# Patient Record
Sex: Female | Born: 1990 | Race: Black or African American | Hispanic: No | Marital: Single | Smoking: Never smoker
Health system: Southern US, Community
[De-identification: ages and names within clinical notes are randomized; demographics above are authoritative.]

## PROBLEM LIST (undated history)

## (undated) DIAGNOSIS — L0291 Cutaneous abscess, unspecified: Secondary | ICD-10-CM

## (undated) DIAGNOSIS — IMO0001 Reserved for inherently not codable concepts without codable children: Secondary | ICD-10-CM

## (undated) DIAGNOSIS — L0390 Cellulitis, unspecified: Secondary | ICD-10-CM

## (undated) DIAGNOSIS — A749 Chlamydial infection, unspecified: Secondary | ICD-10-CM

## (undated) HISTORY — DX: Reserved for inherently not codable concepts without codable children: IMO0001

## (undated) HISTORY — DX: Chlamydial infection, unspecified: A74.9

## (undated) HISTORY — DX: Cellulitis, unspecified: L02.91

## (undated) HISTORY — DX: Cellulitis, unspecified: L03.90

---
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
## 2012-05-26 ENCOUNTER — Other Ambulatory Visit (HOSPITAL_COMMUNITY)
Admission: RE | Admit: 2012-05-26 | Discharge: 2012-05-26 | Disposition: A | Discharge status: Home / Self Care | Payer: Medicaid Other | Source: Ambulatory Visit | Attending: Family Medicine | Admitting: Family Medicine

## 2012-05-26 ENCOUNTER — Encounter: Payer: Self-pay | Admitting: Family Medicine

## 2012-05-26 ENCOUNTER — Ambulatory Visit (INDEPENDENT_AMBULATORY_CARE_PROVIDER_SITE_OTHER): Payer: Medicaid Other | Admitting: Family Medicine

## 2012-05-26 VITALS — BP 120/77 | HR 88 | Temp 98.1°F | Ht 61.75 in | Wt 145.0 lb

## 2012-05-26 DIAGNOSIS — N760 Acute vaginitis: Secondary | ICD-10-CM

## 2012-05-26 DIAGNOSIS — Z00129 Encounter for routine child health examination without abnormal findings: Secondary | ICD-10-CM

## 2012-05-26 DIAGNOSIS — Z124 Encounter for screening for malignant neoplasm of cervix: Secondary | ICD-10-CM

## 2012-05-26 DIAGNOSIS — Z113 Encounter for screening for infections with a predominantly sexual mode of transmission: Secondary | ICD-10-CM | POA: Insufficient documentation

## 2012-05-26 DIAGNOSIS — Z0000 Encounter for general adult medical examination without abnormal findings: Secondary | ICD-10-CM

## 2012-05-26 DIAGNOSIS — N920 Excessive and frequent menstruation with regular cycle: Secondary | ICD-10-CM

## 2012-05-26 DIAGNOSIS — Z01419 Encounter for gynecological examination (general) (routine) without abnormal findings: Secondary | ICD-10-CM | POA: Insufficient documentation

## 2012-05-26 NOTE — Assessment & Plan Note (Signed)
Pap smear today Testing for GC/Chlamydia per patient request. She is sexually active with boyfriend/FOB only

## 2012-05-26 NOTE — Patient Instructions (Addendum)
If your lab results are normal, I will send you a letter with the results. If abnormal, someone at the clinic will get in touch with you.   If you have heavy periods for more than 2 weeks or heavy periods and persistent lightheadedness, drink plenty of fluids (6-8 glasses) and if that does not help, please make an appointment   Follow-up in 1 year for your next physical or sooner if needed

## 2012-05-26 NOTE — Progress Notes (Signed)
  Subjective:    Patient ID: Chloe Curtis, female    DOB: 11-19-1990, 21 y.o.   MRN: [id number]  HPI Well visit  # Irregular vaginal bleeding She received Implanon 06/2010 Since then, she gets periods every 2-3 months that last a few days She started her current period a few days ago and it has been very heavy but has been lightening up ROS: denies lightheadedness  # Preventative visit  Review of Systems Per HPI Denies depression  Allergies, medication, past medical history reviewed.  She is attending university at [employer] and studying psychology    Objective:   Physical Exam GEN: NAD; well-appearing; overweight CV: RRR, no m/r/g PULM: NI WOB ABD: soft, NT, ND GU: normal vulva, vagina, cervix; scant blood in posterior vault; no cervical motion tenderness; uterus non-tender, normal consistency SKIN: 1-2 cap refill; warm, dry    Assessment & Plan:

## 2012-05-26 NOTE — Assessment & Plan Note (Signed)
Heavier period than normal, however, lightening up and hemodynamically stable Given indications to RTC

## 2012-05-27 ENCOUNTER — Encounter: Payer: Self-pay | Admitting: Family Medicine

## 2012-05-28 ENCOUNTER — Telehealth: Payer: Self-pay | Admitting: Family Medicine

## 2012-05-28 DIAGNOSIS — A5601 Chlamydial cystitis and urethritis: Secondary | ICD-10-CM

## 2012-05-28 MED ORDER — AZITHROMYCIN 1 G PO PACK: 1.0000 | PACK | Freq: Once | ORAL | Status: AC

## 2012-05-28 NOTE — Telephone Encounter (Signed)
See problem list

## 2012-05-28 NOTE — Assessment & Plan Note (Signed)
Found on STI testing during well visit. She had it in the past and was treated but her partner was not. Will send Rx for her and partner. Patient called and notified.

## 2012-06-01 ENCOUNTER — Encounter: Payer: Self-pay | Admitting: Family Medicine

## 2012-10-05 ENCOUNTER — Ambulatory Visit (INDEPENDENT_AMBULATORY_CARE_PROVIDER_SITE_OTHER): Payer: Medicaid Other | Admitting: Family Medicine

## 2012-10-05 ENCOUNTER — Other Ambulatory Visit (HOSPITAL_COMMUNITY)
Admission: RE | Admit: 2012-10-05 | Discharge: 2012-10-05 | Disposition: A | Discharge status: Home / Self Care | Payer: Medicaid Other | Source: Ambulatory Visit | Attending: Family Medicine | Admitting: Family Medicine

## 2012-10-05 ENCOUNTER — Ambulatory Visit: Payer: Medicaid Other | Admitting: Family Medicine

## 2012-10-05 VITALS — BP 113/73 | HR 92 | Temp 98.9°F | Ht 61.75 in | Wt 155.0 lb

## 2012-10-05 DIAGNOSIS — N898 Other specified noninflammatory disorders of vagina: Secondary | ICD-10-CM

## 2012-10-05 DIAGNOSIS — Z113 Encounter for screening for infections with a predominantly sexual mode of transmission: Secondary | ICD-10-CM | POA: Insufficient documentation

## 2012-10-05 DIAGNOSIS — K649 Unspecified hemorrhoids: Secondary | ICD-10-CM

## 2012-10-05 DIAGNOSIS — B9689 Other specified bacterial agents as the cause of diseases classified elsewhere: Secondary | ICD-10-CM | POA: Insufficient documentation

## 2012-10-05 DIAGNOSIS — A499 Bacterial infection, unspecified: Secondary | ICD-10-CM

## 2012-10-05 DIAGNOSIS — K644 Residual hemorrhoidal skin tags: Secondary | ICD-10-CM | POA: Insufficient documentation

## 2012-10-05 DIAGNOSIS — N760 Acute vaginitis: Secondary | ICD-10-CM | POA: Insufficient documentation

## 2012-10-05 LAB — POCT WET PREP (WET MOUNT): Clue Cells Wet Prep Whiff POC: POSITIVE

## 2012-10-05 MED ORDER — FLUCONAZOLE 150 MG PO TABS: 150.0000 mg | ORAL_TABLET | Freq: Once | ORAL | Status: DC

## 2012-10-05 MED ORDER — METRONIDAZOLE 500 MG PO TABS: 500.0000 mg | ORAL_TABLET | Freq: Two times a day (BID) | ORAL | Status: DC

## 2012-10-05 NOTE — Patient Instructions (Addendum)
We will call with the results. If you don't hear from [place] by Monday, please call the clinic and remind [place].

## 2012-10-05 NOTE — Progress Notes (Signed)
  Subjective:    Patient ID: Chloe Curtis, female    DOB: 01/08/1991, 22 y.o.   MRN: [id number]  HPI She is concerned about vaginal discharge she has been having and wants to make sure she does not have an infection. She says sometimes it has a foul odor. She is sexually active with her boyfriend only. She has Implanon.  She is also concerned about her hemorrhoids. They have gotten better since she had a baby 1-2 years ago and she wants them checked out.  She is not using anything for them at this time. They do not hurt; sometimes they itch and feel full.   Review of Systems Denies fevers, chills, nausea/vomiting, constipation, straining  Allergies, medication, past medical history reviewed.  -History of chlamydia treated in August 2013      Objective:   Physical Exam Gen: NAD; well-appearing; overweight PSYCH: pleasant, engaged and normally conversant, appropriate to questions, alert and oriented PULM: NI WOB ABD: soft, NT, ND GU:    Vulva: appears normal   Vagina: blood in posterior vault (she started her period)   Cervix: no cervical motion tenderness   Rectum: external hemorrhoids, benign-appearing and non-inflamed and non-tender     Assessment & Plan:

## 2012-10-05 NOTE — Assessment & Plan Note (Addendum)
White discharge without vaginal irritation.  Wet prep positive for clue cells and a few yeast. Will treat both.>>>Called patient and notified Rx metronidazole and Diflucan ready for pick-up.  GC/Chlamydia pending.

## 2012-10-06 ENCOUNTER — Telehealth: Payer: Self-pay | Admitting: Family Medicine

## 2012-10-06 NOTE — Telephone Encounter (Signed)
LM with person who answered the phone to have patient call back. Chloe Curtis, Chloe Curtis

## 2012-10-06 NOTE — Telephone Encounter (Signed)
Please notify negative GC/Chlamydia 

## 2012-10-11 ENCOUNTER — Encounter (HOSPITAL_COMMUNITY): Payer: Self-pay | Admitting: *Deleted

## 2012-10-11 ENCOUNTER — Emergency Department (HOSPITAL_COMMUNITY)
Admission: EM | Admit: 2012-10-11 | Discharge: 2012-10-11 | Disposition: A | Discharge status: Home / Self Care | Payer: Medicaid Other | Attending: Emergency Medicine | Admitting: Emergency Medicine

## 2012-10-11 DIAGNOSIS — J45909 Unspecified asthma, uncomplicated: Secondary | ICD-10-CM | POA: Insufficient documentation

## 2012-10-11 DIAGNOSIS — B9789 Other viral agents as the cause of diseases classified elsewhere: Secondary | ICD-10-CM

## 2012-10-11 DIAGNOSIS — Z8619 Personal history of other infectious and parasitic diseases: Secondary | ICD-10-CM | POA: Insufficient documentation

## 2012-10-11 DIAGNOSIS — R0602 Shortness of breath: Secondary | ICD-10-CM | POA: Insufficient documentation

## 2012-10-11 DIAGNOSIS — Z79899 Other long term (current) drug therapy: Secondary | ICD-10-CM | POA: Insufficient documentation

## 2012-10-11 MED ORDER — AEROCHAMBER PLUS W/MASK MISC
Status: AC
  Filled 2012-10-11: qty 1

## 2012-10-11 MED ORDER — ALBUTEROL SULFATE HFA 108 (90 BASE) MCG/ACT IN AERS
2.0000 | INHALATION_SPRAY | RESPIRATORY_TRACT | Status: DC | PRN
  Administered 2012-10-11: 2 via RESPIRATORY_TRACT
  Filled 2012-10-11: qty 6.7

## 2012-10-11 MED ORDER — ACETAMINOPHEN 325 MG PO TABS
975.0000 mg | ORAL_TABLET | Freq: Once | ORAL | Status: AC
  Administered 2012-10-11: 975 mg via ORAL
  Filled 2012-10-11: qty 3

## 2012-10-11 MED ORDER — AEROCHAMBER PLUS FLO-VU LARGE MISC
1.0000 | Freq: Once | Status: AC
  Administered 2012-10-11: 1

## 2012-10-11 MED ORDER — ALBUTEROL SULFATE (5 MG/ML) 0.5% IN NEBU
5.0000 mg | INHALATION_SOLUTION | Freq: Once | RESPIRATORY_TRACT | Status: AC
  Administered 2012-10-11: 5 mg via RESPIRATORY_TRACT
  Filled 2012-10-11: qty 1

## 2012-10-11 MED ORDER — AEROCHAMBER PLUS FLO-VU SMALL MISC: 1.0000 | Freq: Once | Status: DC

## 2012-10-11 NOTE — ED Notes (Signed)
Pt c/o cough x 4 days and SOB.  Denies fevers, n/v.

## 2012-10-11 NOTE — ED Notes (Signed)
Pt placed on nebulizer mask. Pt mentating appropriately.

## 2012-10-11 NOTE — ED Notes (Signed)
Dr. Jacubowitz at bedside 

## 2012-10-11 NOTE — ED Notes (Signed)
Pt denies nausea/vomiting. Pt denies chills. Pt denies congestion/sore throat. Pt states just dry cough. Pt mentating appropriately. Pt states today she has had increased shortness of breath.

## 2012-10-11 NOTE — ED Notes (Signed)
Pt completed albuterol nebulizer

## 2012-10-11 NOTE — ED Notes (Signed)
Pt ambulatory leaving ED with family. Pt educated on taking acetaminophen at home and when/if need to return to ED. Pt verbalized understanding of teaching and has no further questions upon d/c. Pt does not appear to be in acute distress. Pt leaving with d/c teaching, inhaler, and aerochamber.

## 2012-10-11 NOTE — ED Provider Notes (Signed)
History   This chart was scribed for Doug Sou, MD by Gerlean Ren, ED Scribe. This patient was seen in room TR07C/TR07C and the patient's care was started at 8:00 PM    CSN: [id number]  Arrival date & time 10/11/12  1815   None     Chief Complaint  Patient presents with  . Cough    The history is provided by the patient. No language interpreter was used.   Chloe Curtis is a 22 y.o. female with h/o asthma who presents to the Emergency Department complaining of 4-5 days of constant dry cough with mild difficulty breathing when not coughing that pt states has been worsening today and feels similar to asthma.  Pt does not take any regular medications.  Pt has not used an inhaler in past several years.  LNMP last week.  Pt denies tobacco and alcohol use.  PCP Dr. Madolyn Frieze.  Past Medical History  Diagnosis Date  . Chlamydia infection     x2.  . Teenage mother 03/2010    Preterm labor and delivery @ 35 weeks.  . Abscess and cellulitis     s/p I&D by Dr. Shea Evans [facility])  . Asthma     History reviewed. No pertinent past surgical history.  History reviewed. No pertinent family history.  History  Substance Use Topics  . Smoking status: Never Smoker   . Smokeless tobacco: Not on file  . Alcohol Use: No    No OB history provided.   Review of Systems  Constitutional: Negative.   HENT: Negative.   Respiratory: Positive for cough and shortness of breath.   Cardiovascular: Negative.   Gastrointestinal: Negative.   Musculoskeletal: Negative.   Skin: Negative.   Neurological: Negative.   Hematological: Negative.   Psychiatric/Behavioral: Negative.     Allergies  Advair diskus  Home Medications   Current Outpatient Rx  Name  Route  Sig  Dispense  Refill  . ALBUTEROL SULFATE HFA 108 (90 BASE) MCG/ACT IN AERS   Inhalation   Inhale 2 puffs into the lungs every 4 (four) hours as needed. For shortness of breath          . ETONOGESTREL 68 MG [place]  IMPL   Subcutaneous   Inject 68 mg into the skin.            BP 123/75  Pulse 84  Temp 98.9 F (37.2 C) (Oral)  Resp 16  SpO2 99%  LMP 10/04/2012  Physical Exam  Nursing note and vitals reviewed. Constitutional: She appears well-developed and well-nourished.  HENT:  Head: Normocephalic and atraumatic.  Eyes: Conjunctivae normal are normal. Pupils are equal, round, and reactive to light.  Neck: Neck supple. No tracheal deviation present. No thyromegaly present.  Cardiovascular: Normal rate and regular rhythm.   No murmur heard. Pulmonary/Chest: Effort normal and breath sounds normal.  Abdominal: Soft. Bowel sounds are normal. She exhibits no distension. There is no tenderness.  Musculoskeletal: Normal range of motion. She exhibits no edema and no tenderness.  Neurological: She is alert. Coordination normal.  Skin: Skin is warm and dry. No rash noted.  Psychiatric: She has a normal mood and affect.    ED Course  Procedures (including critical care time) DIAGNOSTIC STUDIES: Oxygen Saturation is 99% on room air, normal by my interpretation.    COORDINATION OF CARE: 8:03 PM- Patient informed of clinical course, understands medical decision-making process, and agrees with plan.  Labs Reviewed - No data to display No results  found.   No diagnosis found.   Patient feels after treatment with albuterol nebulizer and inhaler At 9:35 PM she feels hot and continues to cough, complaining of nasal congestion and myalgias. MDM   I suspect patient has beginnings of influenza Plan Tylenol, albuterol 2 puffs every 4 hours as needed for cough or shortness of breath [facility] if not better in 2 or 3 days Dxviral respiratory illness   I personally performed the services described in this documentation, which was scribed in my presence. The recorded information has been reviewed and is accurate.      Doug Sou, MD 10/11/12 2141

## 2013-02-23 ENCOUNTER — Encounter (HOSPITAL_COMMUNITY): Payer: Self-pay | Admitting: Emergency Medicine

## 2013-02-23 ENCOUNTER — Emergency Department (HOSPITAL_COMMUNITY)
Admission: EM | Admit: 2013-02-23 | Discharge: 2013-02-23 | Disposition: A | Discharge status: Home / Self Care | Payer: Medicaid Other | Attending: Emergency Medicine | Admitting: Emergency Medicine

## 2013-02-23 DIAGNOSIS — Z3202 Encounter for pregnancy test, result negative: Secondary | ICD-10-CM | POA: Insufficient documentation

## 2013-02-23 DIAGNOSIS — R109 Unspecified abdominal pain: Secondary | ICD-10-CM | POA: Insufficient documentation

## 2013-02-23 DIAGNOSIS — Z79899 Other long term (current) drug therapy: Secondary | ICD-10-CM | POA: Insufficient documentation

## 2013-02-23 DIAGNOSIS — J45909 Unspecified asthma, uncomplicated: Secondary | ICD-10-CM | POA: Insufficient documentation

## 2013-02-23 DIAGNOSIS — N760 Acute vaginitis: Secondary | ICD-10-CM | POA: Insufficient documentation

## 2013-02-23 DIAGNOSIS — Z872 Personal history of diseases of the skin and subcutaneous tissue: Secondary | ICD-10-CM | POA: Insufficient documentation

## 2013-02-23 DIAGNOSIS — N72 Inflammatory disease of cervix uteri: Secondary | ICD-10-CM | POA: Insufficient documentation

## 2013-02-23 DIAGNOSIS — Z8619 Personal history of other infectious and parasitic diseases: Secondary | ICD-10-CM | POA: Insufficient documentation

## 2013-02-23 DIAGNOSIS — N898 Other specified noninflammatory disorders of vagina: Secondary | ICD-10-CM | POA: Insufficient documentation

## 2013-02-23 DIAGNOSIS — Z8751 Personal history of pre-term labor: Secondary | ICD-10-CM | POA: Insufficient documentation

## 2013-02-23 DIAGNOSIS — B9689 Other specified bacterial agents as the cause of diseases classified elsewhere: Secondary | ICD-10-CM

## 2013-02-23 LAB — CBC WITH DIFFERENTIAL/PLATELET
Basophils Relative: 0 % (ref 0–1)
HCT: 39.8 % (ref 36.0–46.0)
Hemoglobin: 13.7 g/dL (ref 12.0–15.0)
Lymphocytes Relative: 8 % — ABNORMAL LOW (ref 12–46)
Lymphs Abs: 1.7 10*3/uL (ref 0.7–4.0)
MCHC: 34.4 g/dL (ref 30.0–36.0)
Monocytes Absolute: 0.8 10*3/uL (ref 0.1–1.0)
Monocytes Relative: 4 % (ref 3–12)
Neutro Abs: 17.4 10*3/uL — ABNORMAL HIGH (ref 1.7–7.7)
RBC: 4.64 MIL/uL (ref 3.87–5.11)

## 2013-02-23 LAB — WET PREP, GENITAL: Yeast Wet Prep HPF POC: NONE SEEN

## 2013-02-23 LAB — URINALYSIS, ROUTINE W REFLEX MICROSCOPIC
Ketones, ur: NEGATIVE mg/dL
Leukocytes, UA: NEGATIVE
Protein, ur: NEGATIVE mg/dL
Urobilinogen, UA: 1 mg/dL (ref 0.0–1.0)

## 2013-02-23 LAB — COMPREHENSIVE METABOLIC PANEL
Alkaline Phosphatase: 71 U/L (ref 39–117)
BUN: 13 mg/dL (ref 6–23)
CO2: 25 mEq/L (ref 19–32)
Chloride: 106 mEq/L (ref 96–112)
Creatinine, Ser: 0.72 mg/dL (ref 0.50–1.10)
GFR calc Af Amer: >90 mL/min (ref >90)
GFR calc non Af Amer: >90 mL/min (ref >90)
Glucose, Bld: 113 mg/dL — ABNORMAL HIGH (ref 70–99)
Potassium: 3.9 mEq/L (ref 3.5–5.1)
Total Bilirubin: 0.6 mg/dL (ref 0.3–1.2)

## 2013-02-23 LAB — POCT PREGNANCY, URINE: Preg Test, Ur: NEGATIVE

## 2013-02-23 MED ORDER — OXYCODONE-ACETAMINOPHEN 5-325 MG PO TABS
2.0000 | ORAL_TABLET | Freq: Once | ORAL | Status: AC
  Administered 2013-02-23: 2 via ORAL
  Filled 2013-02-23: qty 2

## 2013-02-23 MED ORDER — OXYCODONE-ACETAMINOPHEN 5-325 MG PO TABS: ORAL_TABLET | ORAL | Status: DC

## 2013-02-23 MED ORDER — METRONIDAZOLE 500 MG PO TABS: 500.0000 mg | ORAL_TABLET | Freq: Two times a day (BID) | ORAL | Status: DC

## 2013-02-23 MED ORDER — LIDOCAINE HCL (PF) 1 % IJ SOLN
INTRAMUSCULAR | Status: AC
  Administered 2013-02-23: 5 mL
  Filled 2013-02-23: qty 5

## 2013-02-23 MED ORDER — CEFTRIAXONE SODIUM 250 MG IJ SOLR
250.0000 mg | Freq: Once | INTRAMUSCULAR | Status: AC
  Administered 2013-02-23: 250 mg via INTRAMUSCULAR
  Filled 2013-02-23: qty 250

## 2013-02-23 MED ORDER — DOXYCYCLINE HYCLATE 100 MG PO CAPS: 100.0000 mg | ORAL_CAPSULE | Freq: Two times a day (BID) | ORAL | Status: DC

## 2013-02-23 MED ORDER — METRONIDAZOLE 500 MG PO TABS
500.0000 mg | ORAL_TABLET | Freq: Once | ORAL | Status: AC
  Administered 2013-02-23: 500 mg via ORAL
  Filled 2013-02-23: qty 1

## 2013-02-23 MED ORDER — DOXYCYCLINE HYCLATE 100 MG PO TABS
100.0000 mg | ORAL_TABLET | Freq: Once | ORAL | Status: AC
  Administered 2013-02-23: 100 mg via ORAL
  Filled 2013-02-23: qty 1

## 2013-02-23 NOTE — ED Notes (Signed)
Pt c/o lower abdominal pain onset 0300. Pt denies n/v. Last BM this morning and normal.

## 2013-02-23 NOTE — ED Provider Notes (Signed)
History     CSN: [id number]  Arrival date & time 02/23/13  1016   First MD Initiated Contact with Patient 02/23/13 1130      Chief Complaint  Patient presents with  . Abdominal Pain    (Consider location/radiation/quality/duration/timing/severity/associated sxs/prior treatment) HPI  Chloe Curtis is a 22 y.o. female complaining of  lower abdominal pain onset this a.m. woke her from sleep at approximately 3 AM it is described as 6/10 sharp and worse with movement. She took 2 acetaminophen pills it did not help. It is not associated with fever, nausea, vomiting, dysuria, frequency, diarrhea or constipation. She states she gets this pain every now and then. But normally goes away and does not last this long. Her periods are irregular. She cannot date of her last period. She has a white vaginal discharge, non-foul-smelling. She normally gets this before her menstruation. She denies dyspareunia. Last GYN exam was within last 6 months.  Past Medical History  Diagnosis Date  . Chlamydia infection     x2.  . Teenage mother 03/2010    Preterm labor and delivery @ 35 weeks.  . Abscess and cellulitis     s/p I&D by Dr. Shea Evans [facility])  . Asthma     History reviewed. No pertinent past surgical history.  No family history on file.  History  Substance Use Topics  . Smoking status: Never Smoker   . Smokeless tobacco: Not on file  . Alcohol Use: No    OB History   Grav Para Term Preterm Abortions TAB SAB Ect Mult Living                  Review of Systems  Constitutional: Negative for fever.  Respiratory: Negative for shortness of breath.   Cardiovascular: Negative for chest pain.  Gastrointestinal: Positive for abdominal pain. Negative for nausea, vomiting and diarrhea.  Genitourinary: Negative for dysuria, frequency and vaginal discharge.  All other systems reviewed and are negative.    Allergies  Advair diskus  Home Medications   Current  Outpatient Rx  Name  Route  Sig  Dispense  Refill  . albuterol (PROVENTIL HFA;VENTOLIN HFA) 108 (90 BASE) MCG/ACT inhaler   Inhalation   Inhale 2 puffs into the lungs every 4 (four) hours as needed. For shortness of breath          . Etonogestrel (IMPLANON) 68 MG IMPL   Subcutaneous   Inject 68 mg into the skin.            BP 116/58  Pulse 76  Temp(Src) 98.3 F (36.8 C) (Oral)  Resp 18  SpO2 98%  LMP 01/12/2013  Physical Exam  Nursing note and vitals reviewed. Constitutional: She is oriented to person, place, and time. She appears well-developed and well-nourished. No distress.  HENT:  Head: Normocephalic and atraumatic.  Mouth/Throat: Oropharynx is clear and moist.  Eyes: Conjunctivae and EOM are normal. Pupils are equal, round, and reactive to light.  Cardiovascular: Normal rate, regular rhythm and intact distal pulses.   Pulmonary/Chest: Effort normal and breath sounds normal. No stridor. No respiratory distress. She has no wheezes. She has no rales. She exhibits no tenderness.  Abdominal: Soft. Bowel sounds are normal. She exhibits no distension and no mass. There is tenderness. There is no rebound and no guarding.  Tenderness to mild palpation of suprapubic area without guarding or rebound.  Genitourinary:  Pelvic exam chaperoned by technician:  No lesions or rashes, no foul smelling or abnormal  vaginal discharge, no adnexal tenderness however patient's cervix is erythematous and friable with cervical motion tenderness.  Musculoskeletal: Normal range of motion.  Neurological: She is alert and oriented to person, place, and time.  Psychiatric: She has a normal mood and affect.    ED Course  Procedures (including critical care time)  Labs Reviewed  WET PREP, GENITAL - Abnormal; Notable for the following:    Clue Cells Wet Prep HPF POC FEW (*)    WBC, Wet Prep HPF POC MANY (*)    All other components within normal limits  CBC WITH DIFFERENTIAL - Abnormal; Notable  for the following:    WBC 19.9 (*)    Neutrophils Relative % 87 (*)    Neutro Abs 17.4 (*)    Lymphocytes Relative 8 (*)    All other components within normal limits  COMPREHENSIVE METABOLIC PANEL - Abnormal; Notable for the following:    Glucose, Bld 113 (*)    All other components within normal limits  GC/CHLAMYDIA PROBE AMP  URINALYSIS, ROUTINE W REFLEX MICROSCOPIC  LIPASE, BLOOD  POCT PREGNANCY, URINE   No results found.  1. Abdominal pain   2. Cervicitis   3. Bacterial vaginosis       MDM   Filed Vitals:   02/23/13 1049 02/23/13 1410  BP: 116/58 122/73  Pulse: 76 77  Temp: 98.3 F (36.8 C) 97.8 F (36.6 C)  TempSrc: Oral Oral  Resp: 18 16  SpO2: 98% 98%     Chloe Curtis is a 22 y.o. female  with lower abdominal pain acute onset this a.m. There are no associated symptoms. Pelvic exam is consistent with PID with cervical motion tenderness. Patient has a leukocytosis of 19 and wet prep shows many white blood cells with a few clue cells. Patient does not appear septic she has no systemic complaints. She is appropriate for outpatient treatment for PID.  Medications  oxyCODONE-acetaminophen (PERCOCET/ROXICET) 5-325 MG per tablet 2 tablet (not administered)    The patient is hemodynamically stable, appropriate for, and amenable to, discharge at this time. Pt verbalized understanding and agrees with care plan. Outpatient follow-up and return precautions given.    Discharge Medication List as of 02/23/2013  1:59 PM    START taking these medications   Details  doxycycline (VIBRAMYCIN) 100 MG capsule Take 1 capsule (100 mg total) by mouth 2 (two) times daily., Starting 02/23/2013, Until Discontinued, Print    metroNIDAZOLE (FLAGYL) 500 MG tablet Take 1 tablet (500 mg total) by mouth 2 (two) times daily., Starting 02/23/2013, Until Discontinued, Print    oxyCODONE-acetaminophen (PERCOCET/ROXICET) 5-325 MG per tablet 1 to 2 tabs PO q6hrs  PRN for pain, Print                Wynetta Emery, PA-C 02/23/13 1901

## 2013-02-24 LAB — GC/CHLAMYDIA PROBE AMP: GC Probe RNA: NEGATIVE

## 2013-02-24 NOTE — ED Provider Notes (Signed)
22 y.o. Female c.o. Lower abdominal pain. Abdomen is soft with some mild si[rapubic pain   I performed a history and physical examination of Chloe Curtis and discussed her management with Chloe Curtis.  I agree with the history, physical, assessment, and plan of care, with the following exceptions: None  I was present for the following procedures: None Time Spent in Critical Care of the patient: None Time spent in discussions with the patient and family: 5  Chloe Curtis S  .    Chloe Quarry, MD 02/24/13 [phone]

## 2013-03-02 ENCOUNTER — Ambulatory Visit (INDEPENDENT_AMBULATORY_CARE_PROVIDER_SITE_OTHER): Payer: Medicaid Other | Admitting: Family Medicine

## 2013-03-02 VITALS — BP 130/75 | HR 67 | Temp 98.2°F | Wt 151.0 lb

## 2013-03-02 DIAGNOSIS — R109 Unspecified abdominal pain: Secondary | ICD-10-CM

## 2013-03-02 DIAGNOSIS — D72829 Elevated white blood cell count, unspecified: Secondary | ICD-10-CM

## 2013-03-02 DIAGNOSIS — R102 Pelvic and perineal pain: Secondary | ICD-10-CM | POA: Insufficient documentation

## 2013-03-02 DIAGNOSIS — R1030 Lower abdominal pain, unspecified: Secondary | ICD-10-CM

## 2013-03-02 LAB — CBC
HCT: 41.5 % (ref 36.0–46.0)
Hemoglobin: 14 g/dL (ref 12.0–15.0)
MCHC: 33.7 g/dL (ref 30.0–36.0)
RDW: 13.4 % (ref 11.5–15.5)
WBC: 9.2 10*3/uL (ref 4.0–10.5)

## 2013-03-02 MED ORDER — FLUCONAZOLE 150 MG PO TABS: 150.0000 mg | ORAL_TABLET | Freq: Once | ORAL | Status: DC

## 2013-03-02 MED ORDER — METRONIDAZOLE 0.75 % VA GEL: 1.0000 | Freq: Two times a day (BID) | VAGINAL | Status: DC

## 2013-03-02 NOTE — Assessment & Plan Note (Signed)
05/28 ED follow-up. Concern for PID but GC/Chlamydia found negative. She had not been tolerating doxycycline, and she feels like the pain is overall improved but still present. Denies GI symptoms.  Wet prep positive for Clue cells but also significant WBC and serum WBC elevated 19.9; she denies fevers or other systemic symptoms -Re-check WBC -Stop doxycycline  -Tx for yeast (endorses dyspareunia and mildly tender vaginal wall on PE without significant erythema) -Tx for BV with Metrogel -Follow-up in 1 week or sooner if symptoms worsen; may cancel appointment if improved

## 2013-03-02 NOTE — Progress Notes (Signed)
  Subjective:    Patient ID: Chloe Curtis, female    DOB: 10-07-1990, 22 y.o.   MRN: [id number]  HPI # She would like referral for gynecology She was seen in the ED for abdominal pain who expressed concern for PID and recommended follow-up   She was seen in the ED 05/28. The pain is improved with oxycodone prn; she has been taking about 1 a day.  QUALITY: sharp pain in her lower abdomen; it does not radiate DURATION: intermittent PROGRESSION: improved with oxycodone  ROS:  White thick vaginal discharge Denies fevers, chills, nausea, vomiting Endorses decreased appetite  Last intercourse: yesterday associated with some pain  LMP: beginning of May she thinks ; history of irregular periods; she "rarely" has periods    Her periods have always been irregular since menarche  Review of Systems Per HPI Denies dysuria/urgency/frequency Endorses occasional lower abdominal pressure Denies constipation, diarrhea (last BM yesterday)  Allergies, medication, past medical history reviewed.  Smoking status noted.     Objective:   Physical Exam GEN: NAD; well-nourished, -appearing PSYCH: alert and oriented PULM: NI WOB ABD: soft, mild tenderness suprapubic area without guarding or rebound EXT: no edema GU:    Vulva: no lesions   Vagina: thin white discharge; no fishy odor; mildly tender; no erythema   Cervix: no lesions; no cervical motion tenderness  Labs 05/28: Wet prep: Positive clue cells Positive WBC  Negative GC/Chlamydia  WBC 19.9     Assessment & Plan:

## 2013-03-02 NOTE — Patient Instructions (Addendum)
Stop doxycycline  Stop metronidazole (oral) Start Metrogel vaginal; use twice a day for 5 days  Ibuprofen 600-800 mg every 8 hours Take with food  Follow-up in 1 week; sooner if you need to; if you feel better you may cancel this appointment

## 2013-03-03 ENCOUNTER — Encounter: Payer: Self-pay | Admitting: Family Medicine

## 2013-03-27 ENCOUNTER — Emergency Department (HOSPITAL_COMMUNITY): Payer: Medicaid Other

## 2013-03-27 ENCOUNTER — Encounter (HOSPITAL_COMMUNITY): Payer: Self-pay | Admitting: Emergency Medicine

## 2013-03-27 ENCOUNTER — Emergency Department (HOSPITAL_COMMUNITY)
Admission: EM | Admit: 2013-03-27 | Discharge: 2013-03-27 | Disposition: A | Discharge status: Home / Self Care | Payer: Medicaid Other | Attending: Emergency Medicine | Admitting: Emergency Medicine

## 2013-03-27 DIAGNOSIS — Z638 Other specified problems related to primary support group: Secondary | ICD-10-CM | POA: Insufficient documentation

## 2013-03-27 DIAGNOSIS — R079 Chest pain, unspecified: Secondary | ICD-10-CM | POA: Insufficient documentation

## 2013-03-27 DIAGNOSIS — R059 Cough, unspecified: Secondary | ICD-10-CM | POA: Insufficient documentation

## 2013-03-27 DIAGNOSIS — R0602 Shortness of breath: Secondary | ICD-10-CM | POA: Insufficient documentation

## 2013-03-27 DIAGNOSIS — Z79899 Other long term (current) drug therapy: Secondary | ICD-10-CM | POA: Insufficient documentation

## 2013-03-27 DIAGNOSIS — J4521 Mild intermittent asthma with (acute) exacerbation: Secondary | ICD-10-CM

## 2013-03-27 DIAGNOSIS — Z8619 Personal history of other infectious and parasitic diseases: Secondary | ICD-10-CM | POA: Insufficient documentation

## 2013-03-27 DIAGNOSIS — J45901 Unspecified asthma with (acute) exacerbation: Secondary | ICD-10-CM | POA: Insufficient documentation

## 2013-03-27 DIAGNOSIS — Z872 Personal history of diseases of the skin and subcutaneous tissue: Secondary | ICD-10-CM | POA: Insufficient documentation

## 2013-03-27 DIAGNOSIS — R05 Cough: Secondary | ICD-10-CM | POA: Insufficient documentation

## 2013-03-27 MED ORDER — DIPHENHYDRAMINE HCL 25 MG PO CAPS
50.0000 mg | ORAL_CAPSULE | Freq: Once | ORAL | Status: AC
  Administered 2013-03-27: 50 mg via ORAL
  Filled 2013-03-27: qty 2

## 2013-03-27 MED ORDER — PREDNISONE 20 MG PO TABS
60.0000 mg | ORAL_TABLET | Freq: Once | ORAL | Status: AC
  Administered 2013-03-27: 60 mg via ORAL
  Filled 2013-03-27: qty 3

## 2013-03-27 MED ORDER — ALBUTEROL SULFATE HFA 108 (90 BASE) MCG/ACT IN AERS
2.0000 | INHALATION_SPRAY | RESPIRATORY_TRACT | Status: DC | PRN
  Administered 2013-03-27: 2 via RESPIRATORY_TRACT
  Filled 2013-03-27: qty 6.7

## 2013-03-27 MED ORDER — ALBUTEROL SULFATE (2.5 MG/3ML) 0.083% IN NEBU: 5.0000 mg | INHALATION_SOLUTION | Freq: Four times a day (QID) | RESPIRATORY_TRACT | Status: DC | PRN

## 2013-03-27 MED ORDER — ALBUTEROL SULFATE (5 MG/ML) 0.5% IN NEBU
INHALATION_SOLUTION | RESPIRATORY_TRACT | Status: AC
  Administered 2013-03-27: 5 mg via RESPIRATORY_TRACT
  Filled 2013-03-27: qty 1

## 2013-03-27 MED ORDER — ALBUTEROL SULFATE (5 MG/ML) 0.5% IN NEBU: 5.0000 mg | INHALATION_SOLUTION | Freq: Once | RESPIRATORY_TRACT | Status: AC

## 2013-03-27 MED ORDER — ONDANSETRON HCL 8 MG PO TABS
8.0000 mg | ORAL_TABLET | Freq: Once | ORAL | Status: AC
  Administered 2013-03-27: 8 mg via ORAL
  Filled 2013-03-27: qty 1

## 2013-03-27 MED ORDER — ALBUTEROL SULFATE (5 MG/ML) 0.5% IN NEBU
5.0000 mg | INHALATION_SOLUTION | Freq: Once | RESPIRATORY_TRACT | Status: AC
  Administered 2013-03-27: 5 mg via RESPIRATORY_TRACT
  Filled 2013-03-27: qty 1

## 2013-03-27 MED ORDER — PREDNISONE 20 MG PO TABS: 60.0000 mg | ORAL_TABLET | Freq: Every day | ORAL | Status: DC

## 2013-03-27 MED ORDER — IPRATROPIUM BROMIDE 0.02 % IN SOLN
0.5000 mg | Freq: Once | RESPIRATORY_TRACT | Status: AC
  Administered 2013-03-27: 0.5 mg via RESPIRATORY_TRACT
  Filled 2013-03-27: qty 2.5

## 2013-03-27 MED ORDER — ONDANSETRON 4 MG PO TBDP
8.0000 mg | ORAL_TABLET | Freq: Once | ORAL | Status: DC
  Filled 2013-03-27: qty 2

## 2013-03-27 NOTE — ED Notes (Signed)
Pt reports asthma flare up onset yesterday. Pt tried inhaler and neb treatment at home without relief. Pt with non productive cough.

## 2013-03-27 NOTE — ED Provider Notes (Signed)
History    CSN: [id number] Arrival date & time 03/27/13  1513  First MD Initiated Contact with Patient 03/27/13 1634     Chief Complaint  Patient presents with  . Asthma   (Consider location/radiation/quality/duration/timing/severity/associated sxs/prior Treatment) Patient is a 22 y.o. female presenting with asthma. The history is provided by the patient.  Asthma This is a recurrent problem. Associated symptoms include chest pain and coughing. Pertinent negatives include no chills, congestion, fever or sore throat. Associated symptoms comments: Increased wheezing over the last 2-3 days without fever. She states it feels like her asthma. She has been using her inhaler without relief. She does have a nebulizer machine but reports "the medicine never works at home, it only works in the ED.".   Past Medical History  Diagnosis Date  . Chlamydia infection     x2.  . Teenage mother 03/2010    Preterm labor and delivery @ 35 weeks.  . Abscess and cellulitis     s/p I&D by Dr. Shea Evans [facility])  . Asthma    History reviewed. No pertinent past surgical history. No family history on file. History  Substance Use Topics  . Smoking status: Never Smoker   . Smokeless tobacco: Not on file  . Alcohol Use: No   OB History   Grav Para Term Preterm Abortions TAB SAB Ect Mult Living                 Review of Systems  Constitutional: Negative for fever and chills.  HENT: Negative.  Negative for congestion, sore throat and rhinorrhea.   Respiratory: Positive for cough, shortness of breath and wheezing.   Cardiovascular: Positive for chest pain.  Gastrointestinal: Negative.   Musculoskeletal: Negative.   Skin: Negative.   Neurological: Negative.     Allergies  Advair diskus  Home Medications   Current Outpatient Rx  Name  Route  Sig  Dispense  Refill  . albuterol (PROVENTIL HFA;VENTOLIN HFA) 108 (90 BASE) MCG/ACT inhaler   Inhalation   Inhale 2 puffs into the  lungs every 4 (four) hours as needed for wheezing or shortness of breath.          . Etonogestrel (IMPLANON) 68 MG IMPL   Subcutaneous   Inject 68 mg into the skin.           BP 129/74  Pulse 117  Temp(Src) 99.6 F (37.6 C) (Oral)  Resp 20  Ht 5\' 1"  (1.549 m)  Wt 150 lb (68.04 kg)  BMI 28.36 kg/m2  SpO2 98% Physical Exam  Constitutional: She is oriented to person, place, and time. She appears well-developed and well-nourished. No distress.  HENT:  Head: Normocephalic.  Neck: Normal range of motion. Neck supple.  Cardiovascular: Normal rate and regular rhythm.   Pulmonary/Chest: Effort normal and breath sounds normal. She has no wheezes. She exhibits no tenderness.  Examined after breathing treatment x 1.   Abdominal: Soft. Bowel sounds are normal. There is no tenderness. There is no rebound and no guarding.  Musculoskeletal: Normal range of motion. She exhibits no edema.  Neurological: She is alert and oriented to person, place, and time.  Skin: Skin is warm and dry. No rash noted.  Psychiatric: She has a normal mood and affect.    ED Course  Procedures (including critical care time) Labs Reviewed - No data to display Dg Chest 2 View (if Patient Has Fever And/or Copd)  03/27/2013   *RADIOLOGY REPORT*  Clinical Data: Shortness of breath  with chest tightness and nausea. History of asthma.  CHEST - 2 VIEW  Comparison: 01/26/2006.  Findings: The heart size and mediastinal contours are stable. There is mild central airway thickening without hyperinflation, confluent airspace opacity or pleural effusion.  The osseous structures appear normal.  IMPRESSION: Mild central airway thickening attributed to asthma.  No focal airspace disease or significant hyperinflation.   Original Report Authenticated By: Carey Bullocks, M.D.   No diagnosis found. 1. Asthma   MDM  She feels only minimally better after nebulizer treatment and still feels some airway resistance despite no wheezing  heard on auscultation. Albuterol and Atrovent given, with prednisone, and patient had subsequent vomiting and heart racing which resolves over time. She feels much better from a respiratory standpoint after second treatment. Stable for discharge.   Arnoldo Hooker, PA-C 03/27/13 2007

## 2013-03-27 NOTE — ED Notes (Signed)
Pt vomited graham crackers and [employer].

## 2013-03-28 ENCOUNTER — Encounter: Payer: Medicaid Other | Admitting: Obstetrics & Gynecology

## 2013-03-28 NOTE — ED Provider Notes (Signed)
Medical screening examination/treatment/procedure(s) were performed by non-physician practitioner and as supervising physician I was immediately available for consultation/collaboration.   Nikai Quest Y. Emaad Nanna, MD 03/28/13 1026 

## 2013-06-14 ENCOUNTER — Telehealth: Payer: Self-pay | Admitting: Family Medicine

## 2013-06-14 NOTE — Telephone Encounter (Signed)
Pt called because she has forgotten the date of  When she had the  nextplanon  Implanted. She knows that it is either September or October. JW

## 2013-06-14 NOTE — Telephone Encounter (Signed)
Left message for pt.  Please inform if she calls back that her implanon was placed 05-2010.  Jazmin Hartsell,CMA

## 2013-06-28 ENCOUNTER — Ambulatory Visit: Payer: Medicaid Other | Admitting: Family Medicine

## 2013-07-01 ENCOUNTER — Ambulatory Visit (INDEPENDENT_AMBULATORY_CARE_PROVIDER_SITE_OTHER): Payer: Medicaid Other | Admitting: Family Medicine

## 2013-07-01 ENCOUNTER — Encounter: Payer: Self-pay | Admitting: Family Medicine

## 2013-07-01 VITALS — BP 118/70 | HR 86 | Temp 99.0°F | Ht 61.0 in | Wt 155.1 lb

## 2013-07-01 DIAGNOSIS — Z3046 Encounter for surveillance of implantable subdermal contraceptive: Secondary | ICD-10-CM | POA: Insufficient documentation

## 2013-07-01 MED ORDER — NORGESTIMATE-ETH ESTRADIOL 0.25-35 MG-MCG PO TABS: 1.0000 | ORAL_TABLET | Freq: Every day | ORAL | Status: DC

## 2013-07-01 NOTE — Patient Instructions (Addendum)
It was nice to see you today.  Implanon was removed successfully.  I sent an oral contraceptive to your pharmacy.

## 2013-07-01 NOTE — Progress Notes (Signed)
Subjective:     Patient ID: Chloe Curtis, female   DOB: Sep 07, 1991, 22 y.o.   MRN: [id number]  HPI 22 year old female presents today for implanon removal.  Her Implanon has been in since 05/2010 and thus is due to be removed.  She has no complaints today.  After removal she would like to start OCP's.  Review of Systems  Constitutional: Negative for fever and chills.  Respiratory: Negative for shortness of breath.   Gastrointestinal: Negative for abdominal pain.  Genitourinary: Negative for dysuria.      Objective:   Physical Exam Filed Vitals:   07/01/13 1516  BP: 118/70  Pulse: 86  Temp: 99 F (37.2 C)   General: well appearing female in NAD.  PROCEDURE Implanon Removal Patient given informed consent for removal of her implanon.  Signed copy in the chart.     Implanon site identified.  Area prepped in usual sterile fashon. 2 cc of 1% lidocaine was used to anesthetize the area at the distal end of the implant. A small incision was made just past the distal end of the implanon using and # 11 blade. The implanon rod was grasped using hemostats and removed without difficulty.  Minimal blood loss. There were no complications.  Steri-strips were applied over the small incision.  A pressure bandage was applied to reduce any bruising.  The patient tolerated the procedure well and was given post procedure instructions.     Assessment:     See Problem list    Plan:

## 2013-07-01 NOTE — Assessment & Plan Note (Signed)
Implanon removed successfully today. Patient started on OCP's for contraception.

## 2013-07-06 ENCOUNTER — Telehealth: Payer: Self-pay | Admitting: Family Medicine

## 2013-07-06 DIAGNOSIS — IMO0001 Reserved for inherently not codable concepts without codable children: Secondary | ICD-10-CM

## 2013-07-06 NOTE — Telephone Encounter (Signed)
Patient on period now.  Only took 2 pills from new pack and has lost the pack.  "I don't think the pills are gonna work for me."  Info routed to Dr. Waynetta Sandy for Depo Provera order.  Will call patient back.  Gaylene Brooks, RN

## 2013-07-06 NOTE — Telephone Encounter (Signed)
Returned call to patient and left message to call our office back.  Will route note to Dr. Waynetta Sandy for advice.  Gaylene Brooks, RN

## 2013-07-06 NOTE — Telephone Encounter (Signed)
Patient recently got Implanon removed. Was placed on oral BC. She began taking the pill but was moving, and in the process of moving, she lost her pack of pills.  Pharmacy said that she would pay out of pocket since it was not time for a new refill. Patient has decided that she now wants to start Depo. Please advise patient on the process to start depo.

## 2013-07-06 NOTE — Telephone Encounter (Signed)
Do I have to order this differently so it doesn't get sent to the pharmacy, but rather our clinic? Haven't ordered depo before. Thanks. Greig Castilla

## 2013-07-07 NOTE — Telephone Encounter (Signed)
Spoke with Dr. Lauris Chroman to accept "verbal order" per Dr. Waynetta Sandy.  Nurse can enter Depo order at time of injection.  Called patient and left message to call our office back to schedule nurse appt for Depo.  Gaylene Brooks, RN

## 2013-07-08 ENCOUNTER — Ambulatory Visit: Payer: Medicaid Other

## 2013-07-26 NOTE — Telephone Encounter (Signed)
Patient has appt scheduled with Dr. Waynetta Sandy for 08/01/13 at 11:00 for PE.  Will discuss getting depo at that time.  Gaylene Brooks, RN

## 2013-08-01 ENCOUNTER — Ambulatory Visit (INDEPENDENT_AMBULATORY_CARE_PROVIDER_SITE_OTHER): Payer: Medicaid Other | Admitting: Family Medicine

## 2013-08-01 ENCOUNTER — Other Ambulatory Visit (HOSPITAL_COMMUNITY)
Admission: RE | Admit: 2013-08-01 | Discharge: 2013-08-01 | Disposition: A | Discharge status: Home / Self Care | Payer: Medicaid Other | Source: Ambulatory Visit | Attending: Family Medicine | Admitting: Family Medicine

## 2013-08-01 ENCOUNTER — Encounter: Payer: Self-pay | Admitting: Family Medicine

## 2013-08-01 VITALS — BP 125/73 | HR 79 | Temp 98.3°F | Ht 61.0 in | Wt 150.0 lb

## 2013-08-01 DIAGNOSIS — Z309 Encounter for contraceptive management, unspecified: Secondary | ICD-10-CM

## 2013-08-01 DIAGNOSIS — Z113 Encounter for screening for infections with a predominantly sexual mode of transmission: Secondary | ICD-10-CM

## 2013-08-01 DIAGNOSIS — Z0000 Encounter for general adult medical examination without abnormal findings: Secondary | ICD-10-CM

## 2013-08-01 LAB — POCT URINE PREGNANCY: Preg Test, Ur: NEGATIVE

## 2013-08-01 MED ORDER — MEDROXYPROGESTERONE ACETATE 150 MG/ML IM SUSP
150.0000 mg | Freq: Once | INTRAMUSCULAR | Status: AC
  Administered 2013-08-01: 150 mg via INTRAMUSCULAR

## 2013-08-01 NOTE — Assessment & Plan Note (Addendum)
Declines flu shot today (thinks it will make her get sick). Would like repeat GC/chlamydia, HIV, RPR testing today. Had a normal pap smear 04/2012.

## 2013-08-01 NOTE — Assessment & Plan Note (Signed)
Nexplanon removed in 06/2013 (placed 05/2010). Will use interim depo shot and schedule another appt for a new nexplanon (patient did not think she could get another one when it was removed).

## 2013-08-01 NOTE — Progress Notes (Signed)
  Subjective:    Patient ID: Chloe Curtis, female    DOB: 26-Apr-1991, 22 y.o.   MRN: [id number]  HPI  Annual physical  # Health maintenance - negative pap smear August 2013 - Tobacco use: none - Alcohol use: none - Sexual hx: same partner for last 3 years.  - Would like GC and HIV testing today  # Contraception - nexplanon removed 1 month ago - thought she couldn't get another nexplanon, would like to still get depo today and reschedule for new insertion in about 3 months.  # Asthma - believes it is well controlled - sees flares in winter, takes albuterol only as needed - no controller medications.  Review of Systems No unexpected weight change, no heartburn, no chest pain, SOB with asthma triggers, no diarrhea/constipation, no burning with urination. Denies vaginal itchy or discharge.    Objective:   Physical Exam BP 125/73  Pulse 79  Temp(Src) 98.3 F (36.8 C) (Oral)  Ht 5\' 1"  (1.549 m)  Wt 150 lb (68.04 kg)  BMI 28.36 kg/m2  LMP 07/27/2013  General: NAD, sitting with daughter and boyfriend HEENT: PERRL, EOMI. CV: RRR, normal heart sounds, no murmurs Resp: CTAB, effort normal Abd: bowel sounds present, soft, nontender, nondistended Ext: no edema or cyanosis. 2+ radial and PT pulses bilaterally. Skin: warm, dry. No rashes noted.  Speculum: NAVM, bloody mucous present.  Bimanual exam: slight tenderness bilaterally, ovaries normal size.     Assessment & Plan:  See Problem List Documentation.

## 2013-08-01 NOTE — Patient Instructions (Addendum)
It was nice to meet you today.   If the results of the STD testing are negative we will send you a letter. If they are positive someone from clinic will contact you.   Schedule an appointment for about 3 months from now for nexplanon insertion.

## 2013-08-02 LAB — RPR

## 2013-08-02 LAB — HIV ANTIBODY (ROUTINE TESTING W REFLEX): HIV: NONREACTIVE

## 2013-08-04 ENCOUNTER — Encounter: Payer: Self-pay | Admitting: Family Medicine

## 2013-08-11 ENCOUNTER — Emergency Department (HOSPITAL_COMMUNITY)
Admission: EM | Admit: 2013-08-11 | Discharge: 2013-08-11 | Disposition: A | Discharge status: Home / Self Care | Payer: Medicaid Other | Attending: Emergency Medicine | Admitting: Emergency Medicine

## 2013-08-11 ENCOUNTER — Encounter (HOSPITAL_COMMUNITY): Payer: Self-pay | Admitting: Emergency Medicine

## 2013-08-11 DIAGNOSIS — Y929 Unspecified place or not applicable: Secondary | ICD-10-CM | POA: Insufficient documentation

## 2013-08-11 DIAGNOSIS — T161XXA Foreign body in right ear, initial encounter: Secondary | ICD-10-CM

## 2013-08-11 DIAGNOSIS — Z872 Personal history of diseases of the skin and subcutaneous tissue: Secondary | ICD-10-CM | POA: Insufficient documentation

## 2013-08-11 DIAGNOSIS — J45909 Unspecified asthma, uncomplicated: Secondary | ICD-10-CM | POA: Insufficient documentation

## 2013-08-11 DIAGNOSIS — X58XXXA Exposure to other specified factors, initial encounter: Secondary | ICD-10-CM | POA: Insufficient documentation

## 2013-08-11 DIAGNOSIS — Y939 Activity, unspecified: Secondary | ICD-10-CM | POA: Insufficient documentation

## 2013-08-11 DIAGNOSIS — Z8619 Personal history of other infectious and parasitic diseases: Secondary | ICD-10-CM | POA: Insufficient documentation

## 2013-08-11 DIAGNOSIS — Z79899 Other long term (current) drug therapy: Secondary | ICD-10-CM | POA: Insufficient documentation

## 2013-08-11 DIAGNOSIS — T169XXA Foreign body in ear, unspecified ear, initial encounter: Secondary | ICD-10-CM | POA: Insufficient documentation

## 2013-08-11 MED ORDER — CIPROFLOXACIN-DEXAMETHASONE 0.3-0.1 % OT SUSP
4.0000 [drp] | Freq: Two times a day (BID) | OTIC | Status: DC
  Administered 2013-08-11: 4 [drp] via OTIC
  Filled 2013-08-11: qty 7.5

## 2013-08-11 NOTE — ED Provider Notes (Signed)
CSN: [id number]     Arrival date & time 08/11/13  2121 History   First MD Initiated Contact with Patient 08/11/13 2257     Chief Complaint  Patient presents with  . Foreign Body in Ear   (Consider location/radiation/quality/duration/timing/severity/associated sxs/prior Treatment) HPI  22 year old female here for management of fb in R ear.  Pt report her ears has been itchy for the past 4-5 days . She uses Q-tip to help relieve the itch which helps.  Today while using Qtip on R ear it broke off.  She denies any significant pain or hearing changes but unable to retrieve the Q-tip.  Denies fever, chills, ringing in ear, jaw pain, sneeze, cough.    Past Medical History  Diagnosis Date  . Chlamydia infection     x2.  . Teenage mother 03/2010    Preterm labor and delivery @ 35 weeks.  . Abscess and cellulitis     s/p I&D by Dr. Shea Evans PCP [facility])  . Asthma    History reviewed. No pertinent past surgical history. History reviewed. No pertinent family history. History  Substance Use Topics  . Smoking status: Never Smoker   . Smokeless tobacco: Not on file  . Alcohol Use: No   OB History   Grav Para Term Preterm Abortions TAB SAB Ect Mult Living                 Review of Systems  Constitutional: Negative for fever.  HENT: Negative for ear discharge, ear pain and hearing loss.   Skin: Negative for rash.    Allergies  Advair diskus  Home Medications   Current Outpatient Rx  Name  Route  Sig  Dispense  Refill  . albuterol (PROVENTIL HFA;VENTOLIN HFA) 108 (90 BASE) MCG/ACT inhaler   Inhalation   Inhale 2 puffs into the lungs every 4 (four) hours as needed for wheezing or shortness of breath.          Marland Kitchen albuterol (PROVENTIL) (2.5 MG/3ML) 0.083% nebulizer solution   Nebulization   Take 6 mLs (5 mg total) by nebulization every 6 (six) hours as needed for wheezing.   75 mL   12    BP 117/62  Pulse 85  Temp(Src) 98.3 F (36.8 C) (Oral)  Resp 16  Ht 5'  1" (1.549 m)  Wt 151 lb 8 oz (68.72 kg)  BMI 28.64 kg/m2  SpO2 97%  LMP 07/27/2013 Physical Exam  Nursing note and vitals reviewed. Constitutional: She appears well-developed and well-nourished. No distress.  HENT:  Head: Atraumatic.  Left Ear: External ear normal.  R ear with retained head of Q-tip.  No obvious discharge, no inflammation.    Eyes: Conjunctivae are normal.  Neck: Normal range of motion. Neck supple.  Skin: No rash noted.  Psychiatric: She has a normal mood and affect.    ED Course  FOREIGN BODY REMOVAL Date/Time: 08/11/2013 11:12 PM Performed by: Fayrene Helper Authorized by: Fayrene Helper Consent: Verbal consent obtained. Risks and benefits: risks, benefits and alternatives were discussed Consent given by: patient Patient understanding: patient states understanding of the procedure being performed Patient consent: the patient's understanding of the procedure matches consent given Patient identity confirmed: verbally with patient and arm band Time out: Immediately prior to procedure a "time out" was called to verify the correct patient, procedure, equipment, support staff and site/side marked as required. Body area: ear Location details: right ear Patient sedated: no Patient restrained: no Patient cooperative: yes Localization method: magnification  and visualized Removal mechanism: forceps Complexity: simple 1 objects recovered. Objects recovered: Q-tip, head Post-procedure assessment: foreign body removed Patient tolerance: Patient tolerated the procedure well with no immediate complications.   (including critical care time)  11:11 PM Pt with retained Q-tip head in R ear, successfully removed by me.  TM is intact but does appears erythematous without effusion.  R ear canal mildly irritated but no significant edema.    11:14 PM Plan to provide ciprodex ear drop as pt has c/o ear irritation as well as recent ear trauma.  ENT referral given as needed.     Labs Review Labs Reviewed - No data to display Imaging Review No results found.  EKG Interpretation   None       MDM   1. Foreign body of right ear, initial encounter    BP 117/62  Pulse 85  Temp(Src) 98.3 F (36.8 C) (Oral)  Resp 16  Ht 5\' 1"  (1.549 m)  Wt 151 lb 8 oz (68.72 kg)  BMI 28.64 kg/m2  SpO2 97%  LMP 07/27/2013     Fayrene Helper, PA-C 08/11/13 2328

## 2013-08-11 NOTE — ED Provider Notes (Signed)
Medical screening examination/treatment/procedure(s) were performed by non-physician practitioner and as supervising physician I was immediately available for consultation/collaboration.   Pallas Wahlert B. Boe Deans, MD 08/11/13 2355 

## 2013-08-11 NOTE — ED Notes (Signed)
Patient with a Qtip in right ear.

## 2013-10-24 ENCOUNTER — Encounter: Payer: Self-pay | Admitting: Family Medicine

## 2013-10-24 ENCOUNTER — Ambulatory Visit (INDEPENDENT_AMBULATORY_CARE_PROVIDER_SITE_OTHER): Payer: Medicaid Other | Admitting: Family Medicine

## 2013-10-24 VITALS — BP 133/74 | HR 92 | Temp 98.3°F | Ht 61.0 in | Wt 154.0 lb

## 2013-10-24 DIAGNOSIS — Z309 Encounter for contraceptive management, unspecified: Secondary | ICD-10-CM

## 2013-10-24 MED ORDER — MEDROXYPROGESTERONE ACETATE 150 MG/ML IM SUSP
150.0000 mg | Freq: Once | INTRAMUSCULAR | Status: AC
  Administered 2013-10-24: 150 mg via INTRAMUSCULAR

## 2013-10-24 NOTE — Patient Instructions (Signed)
Thank you for coming in today.  Please schedule a nursing visit for depo shot 3 months from now. If you wish to change contraceptive methods, please schedule an appointment with me.

## 2013-10-24 NOTE — Assessment & Plan Note (Addendum)
Here for depo shot, she is not interested in nexplanon at this time. Within the timeframe she does not require a urine preg test.

## 2013-10-24 NOTE — Progress Notes (Signed)
   Subjective:    Patient ID: Chloe Curtis, female    DOB: [birth year], 23 y.o.   MRN: [id number]  HPI  Here initially for "3 month physical", however she had an annual in 07/2013. She does not need a pap smear until age 89.   # Contraceptive management - Also here for depo shot. At this time not interested in getting nexplanon.  Review of Systems No change in weight, no CP, no SOB, no abdominal pain. No vaginal discharge.    Objective:   Physical Exam BP 133/74  Pulse 92  Temp(Src) 98.3 F (36.8 C) (Oral)  Ht 5\' 1"  (1.549 m)  Wt 154 lb (69.854 kg)  BMI 29.11 kg/m2  General: NAD CV: RRR, normal heart sounds Resp: CTAB, normal effort Abdomen: soft, NTND      Assessment & Plan:  See Problem list documentation

## 2014-02-06 ENCOUNTER — Ambulatory Visit (INDEPENDENT_AMBULATORY_CARE_PROVIDER_SITE_OTHER): Payer: Medicaid Other | Admitting: *Deleted

## 2014-02-06 DIAGNOSIS — Z3042 Encounter for surveillance of injectable contraceptive: Secondary | ICD-10-CM

## 2014-02-06 DIAGNOSIS — Z3049 Encounter for surveillance of other contraceptives: Secondary | ICD-10-CM

## 2014-02-06 LAB — POCT URINE PREGNANCY: Preg Test, Ur: NEGATIVE

## 2014-02-06 MED ORDER — MEDROXYPROGESTERONE ACETATE 150 MG/ML IM SUSP
150.0000 mg | Freq: Once | INTRAMUSCULAR | Status: AC
  Administered 2014-02-06: 150 mg via INTRAMUSCULAR

## 2014-02-06 NOTE — Progress Notes (Signed)
   Pt late for Depo Provera injection.  Pregnancy test ordered; results negative.  Pt stated last sex was two days ago.  Advised pt to used another reliable form of birth control x 7 days and to take another home pregnancy test in one week.  Pt0 tolerated Depo injection. Depo given Left upper outer quadrant.  Next injection due July 27 - May 08, 2014.  Reminder card given. Clovis PuMartin, Latiana Tomei L, RN

## 2014-03-31 ENCOUNTER — Telehealth: Payer: Self-pay | Admitting: Family Medicine

## 2014-03-31 MED ORDER — AEROCHAMBER PLUS W/MASK MISC: Status: DC

## 2014-03-31 MED ORDER — ALBUTEROL SULFATE HFA 108 (90 BASE) MCG/ACT IN AERS: 2.0000 | INHALATION_SPRAY | RESPIRATORY_TRACT | Status: AC | PRN

## 2014-03-31 NOTE — Telephone Encounter (Signed)
Family Medicine Emergency Line Telephone Note  Received call from Ms. Swazil[place]Jordan at approximately 12:10pm with the chief complaint that she has had cough, wheezing and worsening shortness of breath over the past couple days. She has run out of her albuterol inhaler and tried to call to schedule a same-day appointment but the office is closed today. She was speaking in full sentences. She has medicad and has no problems affording her medications.   I reordered albuterol with a spacer and gave directions to take 4 puffs every 20 minutes up to 3 times until symptoms improved, then to take 4 puffs every 4 hours x24 hours. If symptoms do not improve with 3 consecutive treatments, go to the ED. I verified understanding by teach back and she will make an appointment early next week.   Erica Osuna B. Jarvis NewcomerGrunz, MD, PGY-2 03/31/2014 12:18 PM

## 2014-05-08 ENCOUNTER — Ambulatory Visit: Payer: Medicaid Other

## 2014-05-09 ENCOUNTER — Ambulatory Visit (INDEPENDENT_AMBULATORY_CARE_PROVIDER_SITE_OTHER): Payer: Medicaid Other | Admitting: *Deleted

## 2014-05-09 DIAGNOSIS — Z3042 Encounter for surveillance of injectable contraceptive: Secondary | ICD-10-CM

## 2014-05-09 DIAGNOSIS — Z3049 Encounter for surveillance of other contraceptives: Secondary | ICD-10-CM

## 2014-05-09 LAB — POCT URINE PREGNANCY: PREG TEST UR: NEGATIVE

## 2014-05-09 MED ORDER — MEDROXYPROGESTERONE ACETATE 150 MG/ML IM SUSP
150.0000 mg | Freq: Once | INTRAMUSCULAR | Status: AC
  Administered 2014-05-09: 150 mg via INTRAMUSCULAR

## 2014-05-09 NOTE — Progress Notes (Signed)
   Pt a day late for Depo Provera injection.  Pregnancy test ordered; results negative.  Pt tolerated Depo injection. Depo given right upper outer quadrant.  Next injection due Oct 27-Aug 08, 2014.  Reminder card given. Clovis PuMartin, Tamika L, RN

## 2014-08-30 ENCOUNTER — Ambulatory Visit: Payer: Medicaid Other

## 2015-01-24 ENCOUNTER — Ambulatory Visit (INDEPENDENT_AMBULATORY_CARE_PROVIDER_SITE_OTHER): Payer: Self-pay | Admitting: Family Medicine

## 2015-01-24 ENCOUNTER — Encounter: Payer: Self-pay | Admitting: Family Medicine

## 2015-01-24 ENCOUNTER — Other Ambulatory Visit (HOSPITAL_COMMUNITY)
Admission: RE | Admit: 2015-01-24 | Discharge: 2015-01-24 | Disposition: A | Discharge status: Home / Self Care | Payer: Medicaid Other | Source: Ambulatory Visit | Attending: Family Medicine | Admitting: Family Medicine

## 2015-01-24 VITALS — BP 114/73 | HR 97 | Temp 99.1°F | Ht 61.0 in | Wt 151.0 lb

## 2015-01-24 DIAGNOSIS — N898 Other specified noninflammatory disorders of vagina: Secondary | ICD-10-CM

## 2015-01-24 DIAGNOSIS — Z1151 Encounter for screening for human papillomavirus (HPV): Secondary | ICD-10-CM | POA: Insufficient documentation

## 2015-01-24 DIAGNOSIS — K648 Other hemorrhoids: Secondary | ICD-10-CM

## 2015-01-24 DIAGNOSIS — Z20828 Contact with and (suspected) exposure to other viral communicable diseases: Secondary | ICD-10-CM

## 2015-01-24 DIAGNOSIS — Z01411 Encounter for gynecological examination (general) (routine) with abnormal findings: Secondary | ICD-10-CM | POA: Diagnosis present

## 2015-01-24 DIAGNOSIS — Z113 Encounter for screening for infections with a predominantly sexual mode of transmission: Secondary | ICD-10-CM | POA: Diagnosis present

## 2015-01-24 DIAGNOSIS — Z0189 Encounter for other specified special examinations: Secondary | ICD-10-CM

## 2015-01-24 DIAGNOSIS — Z124 Encounter for screening for malignant neoplasm of cervix: Secondary | ICD-10-CM

## 2015-01-24 DIAGNOSIS — Z0000 Encounter for general adult medical examination without abnormal findings: Secondary | ICD-10-CM

## 2015-01-24 DIAGNOSIS — K644 Residual hemorrhoidal skin tags: Secondary | ICD-10-CM

## 2015-01-24 DIAGNOSIS — Z202 Contact with and (suspected) exposure to infections with a predominantly sexual mode of transmission: Secondary | ICD-10-CM

## 2015-01-24 LAB — POCT WET PREP (WET MOUNT): Clue Cells Wet Prep Whiff POC: POSITIVE

## 2015-01-24 NOTE — Progress Notes (Signed)
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   

## 2015-01-24 NOTE — Patient Instructions (Signed)
Today we checked: Pap smear (looks for signs of cervical cancer) Gonorrhea and chlamydia, syphilis, HIV testing Wet prep to look for other reasons for vaginal discharge  If everything is normal, you can expect a letter in the mail.  You do have an external hemorrhoid that may be causing some of the bleeding. You should make sure you get enough fiber in your diet (you can buy this at the pharmacy if you don't eat enough foods high in fiber, it is normally called Psyllium powder or Fiber). There are also over the counter steroid creams you can use for itching or discomfort. If it becomes painful you should be seen by a doctor.

## 2015-01-24 NOTE — Assessment & Plan Note (Addendum)
External hemorrhoid, non-bleeding, non-tender. Discussed conservative management for now as it doesn't bother her that much. Can add daily fiber or increase fiber in diet. If itching can use OTC steroid cream. Pt declines internal exam today. F/u if worsens, becomes painful, more bleeding than just on the tissue.

## 2015-01-24 NOTE — Assessment & Plan Note (Signed)
Checking wet prep, GC/chlamydia. Will f/u with pt with results.

## 2015-01-24 NOTE — Assessment & Plan Note (Signed)
Pap done today as well as STD testing.

## 2015-01-24 NOTE — Progress Notes (Signed)
   Subjective:    Patient ID: Chloe Curtis, female    DOB: 10/06/1990, 24 y.o.   MRN: [id number]  HPI  CC: vaginal discharge/annual visit  # Vaginal discharge:  ~2 months  Everyday, feels the amount is getting worse  No smell, doesn't itch.  Whitish/yellow, thin/liquidy  No new sexual partners  Would like STD testing today ROS: no fevers/chills  # Hemorrhoids  Has felt a "tag" around her rectum  Discussed with prior doctors but says never examined so never diagnosed with hemorrhoids  Recently she has noticed that when she wipes some blood gets on the tissue  Denies constipation  No pain with defecation, no abdominal pain ROS: no dizziness, no fatigue  Review of Systems   See HPI for ROS. Also positive for headaches, some trouble breathing. All other systems reviewed and are negative.  Past medical history, surgical, family, and social history reviewed and updated in the EMR as appropriate. Objective:  BP 114/73 mmHg  Pulse 97  Temp(Src) 99.1 F (37.3 C) (Oral)  Ht 5\' 1"  (1.549 m)  Wt 151 lb (68.493 kg)  BMI 28.55 kg/m2 Vitals and nursing note reviewed  General: NAD Eyes: EOMI CV: RRR, normal s1 and s2, no mrg Resp: CTAB, normal effort GU: normal external genitalia, NAVM, thick yellow/green discharge in vaginal vault, normal appearance of cervix. Ovaries normal sized, no adnexal or CVM tenderness. External hemorrhoid noted at 12 oclock, non-bleeding, non-tender. Ext: no edema or cyanosis Neuro: alert and oriented, no focal deficits  Assessment & Plan:  See Problem List Documentation

## 2015-01-25 LAB — CERVICOVAGINAL ANCILLARY ONLY
Chlamydia: NEGATIVE
Neisseria Gonorrhea: NEGATIVE

## 2015-01-25 LAB — HIV ANTIBODY (ROUTINE TESTING W REFLEX): HIV: NONREACTIVE

## 2015-01-25 LAB — RPR

## 2015-01-25 LAB — CYTOLOGY - PAP

## 2015-02-06 ENCOUNTER — Telehealth: Payer: Self-pay | Admitting: Family Medicine

## 2015-02-06 MED ORDER — METRONIDAZOLE 500 MG PO TABS: 500.0000 mg | ORAL_TABLET | Freq: Two times a day (BID) | ORAL | Status: DC

## 2015-02-06 NOTE — Telephone Encounter (Signed)
Called pt regarding normal STD testing, normal pap, but wet prep did show some evidence of BV. She says discharge had improved since office visit. I offered to send in rx for flagyl that she could take for BV if she felt she was still having discharge. Recommended repeat pap smear in 3 years per routine screening guidelines. -Dr. Waynetta SandyWight

## 2015-12-22 ENCOUNTER — Encounter (HOSPITAL_COMMUNITY): Payer: Self-pay | Admitting: Nurse Practitioner

## 2015-12-22 ENCOUNTER — Emergency Department (HOSPITAL_COMMUNITY)
Admission: EM | Admit: 2015-12-22 | Discharge: 2015-12-22 | Disposition: A | Discharge status: Home / Self Care | Payer: Self-pay | Attending: Emergency Medicine | Admitting: Emergency Medicine

## 2015-12-22 ENCOUNTER — Emergency Department (HOSPITAL_COMMUNITY): Payer: Self-pay

## 2015-12-22 DIAGNOSIS — Z872 Personal history of diseases of the skin and subcutaneous tissue: Secondary | ICD-10-CM | POA: Insufficient documentation

## 2015-12-22 DIAGNOSIS — Z79899 Other long term (current) drug therapy: Secondary | ICD-10-CM | POA: Insufficient documentation

## 2015-12-22 DIAGNOSIS — Z3202 Encounter for pregnancy test, result negative: Secondary | ICD-10-CM | POA: Insufficient documentation

## 2015-12-22 DIAGNOSIS — R102 Pelvic and perineal pain: Secondary | ICD-10-CM

## 2015-12-22 DIAGNOSIS — N760 Acute vaginitis: Secondary | ICD-10-CM | POA: Insufficient documentation

## 2015-12-22 DIAGNOSIS — J45909 Unspecified asthma, uncomplicated: Secondary | ICD-10-CM | POA: Insufficient documentation

## 2015-12-22 DIAGNOSIS — B9689 Other specified bacterial agents as the cause of diseases classified elsewhere: Secondary | ICD-10-CM

## 2015-12-22 DIAGNOSIS — Z8619 Personal history of other infectious and parasitic diseases: Secondary | ICD-10-CM | POA: Insufficient documentation

## 2015-12-22 LAB — CBC WITH DIFFERENTIAL/PLATELET
Basophils Absolute: 0 10*3/uL (ref 0.0–0.1)
Basophils Relative: 0 %
Eosinophils Absolute: 0.1 10*3/uL (ref 0.0–0.7)
Eosinophils Relative: 1 %
HEMATOCRIT: 42.6 % (ref 36.0–46.0)
HEMOGLOBIN: 13.7 g/dL (ref 12.0–15.0)
LYMPHS PCT: 18 %
Lymphs Abs: 1.9 10*3/uL (ref 0.7–4.0)
MCH: 29.1 pg (ref 26.0–34.0)
MCHC: 32.2 g/dL (ref 30.0–36.0)
MCV: 90.4 fL (ref 78.0–100.0)
Monocytes Absolute: 0.4 10*3/uL (ref 0.1–1.0)
Monocytes Relative: 4 %
NEUTROS ABS: 8.3 10*3/uL — AB (ref 1.7–7.7)
NEUTROS PCT: 77 %
Platelets: 247 10*3/uL (ref 150–400)
RBC: 4.71 MIL/uL (ref 3.87–5.11)
RDW: 12.5 % (ref 11.5–15.5)
WBC: 10.8 10*3/uL — AB (ref 4.0–10.5)

## 2015-12-22 LAB — COMPREHENSIVE METABOLIC PANEL
ALT: 21 U/L (ref 14–54)
AST: 19 U/L (ref 15–41)
Albumin: 3.8 g/dL (ref 3.5–5.0)
Alkaline Phosphatase: 56 U/L (ref 38–126)
Anion gap: 8 (ref 5–15)
BILIRUBIN TOTAL: 0.6 mg/dL (ref 0.3–1.2)
BUN: 10 mg/dL (ref 6–20)
CHLORIDE: 111 mmol/L (ref 101–111)
CO2: 23 mmol/L (ref 22–32)
Calcium: 9.1 mg/dL (ref 8.9–10.3)
Creatinine, Ser: 0.86 mg/dL (ref 0.44–1.00)
GFR calc Af Amer: >60 mL/min (ref >60)
GLUCOSE: 109 mg/dL — AB (ref 65–99)
Potassium: 4.1 mmol/L (ref 3.5–5.1)
Sodium: 142 mmol/L (ref 135–145)
TOTAL PROTEIN: 6.7 g/dL (ref 6.5–8.1)

## 2015-12-22 LAB — I-STAT BETA HCG BLOOD, ED (MC, WL, AP ONLY)

## 2015-12-22 LAB — URINALYSIS, ROUTINE W REFLEX MICROSCOPIC
Bilirubin Urine: NEGATIVE
Glucose, UA: NEGATIVE mg/dL
Ketones, ur: NEGATIVE mg/dL
Leukocytes, UA: NEGATIVE
Nitrite: NEGATIVE
Protein, ur: NEGATIVE mg/dL
SPECIFIC GRAVITY, URINE: 1.013 (ref 1.005–1.030)
pH: 7.5 (ref 5.0–8.0)

## 2015-12-22 LAB — URINE MICROSCOPIC-ADD ON: BACTERIA UA: NONE SEEN

## 2015-12-22 LAB — WET PREP, GENITAL
Sperm: NONE SEEN
Trich, Wet Prep: NONE SEEN
YEAST WET PREP: NONE SEEN

## 2015-12-22 LAB — RAPID HIV SCREEN (HIV 1/2 AB+AG)
HIV 1/2 ANTIBODIES: NONREACTIVE
HIV-1 P24 Antigen - HIV24: NONREACTIVE

## 2015-12-22 MED ORDER — METRONIDAZOLE 500 MG PO TABS
500.0000 mg | ORAL_TABLET | Freq: Once | ORAL | Status: AC
  Administered 2015-12-22: 500 mg via ORAL
  Filled 2015-12-22: qty 1

## 2015-12-22 MED ORDER — METRONIDAZOLE 500 MG PO TABS: 500.0000 mg | ORAL_TABLET | Freq: Two times a day (BID) | ORAL | Status: DC

## 2015-12-22 NOTE — ED Provider Notes (Signed)
CSN: [id number]     Arrival date & time 12/22/15  1701 History   First MD Initiated Contact with Patient 12/22/15 1728     Chief Complaint  Patient presents with  . Pelvic Pain     (Consider location/radiation/quality/duration/timing/severity/associated sxs/prior Treatment) HPI   Chloe Curtis is a 25 y.o. female, with a history of chlamydia and asthma, presenting to the ED with lower abdominal pain that began 3 weeks ago. Patient states that the pain is cramping in nature, intermittent, located centrally in the lower pelvis, rates it 7 out of 10, nonradiating. Also endorses some intermittent increased vaginal discharge without odor or color change. Patient has not taken anything for her symptoms. Patient is currently on her menstrual cycle that started 2 days ago. Her last menstrual cycle before this was at the beginning of February. Patient states that she has irregular periods. Patient has not been using contraceptives for the past year. Before this, patient was on Depo-Provera. Patient denies fever/chills, N/V/C/D, lower back pain, or any other complaints.    Past Medical History  Diagnosis Date  . Chlamydia infection     x2.  . Teenage mother 03/2010    Preterm labor and delivery @ 35 weeks.  . Abscess and cellulitis     s/p I&D by Dr. Shea EvansNeal/previous PCP [facility])  . Asthma    History reviewed. No pertinent past surgical history. History reviewed. No pertinent family history. Social History  Substance Use Topics  . Smoking status: Never Smoker   . Smokeless tobacco: None  . Alcohol Use: No   OB History    No data available     Review of Systems  Constitutional: Negative for fever and chills.  Gastrointestinal: Negative for nausea, vomiting, diarrhea, constipation and blood in stool.  Genitourinary: Positive for vaginal discharge and pelvic pain. Negative for dysuria and hematuria.  Musculoskeletal: Negative for back pain.  Skin: Negative for color change and  pallor.  All other systems reviewed and are negative.     Allergies  Advair diskus  Home Medications   Prior to Admission medications   Medication Sig Start Date End Date Taking? Authorizing Provider  albuterol (PROVENTIL HFA;VENTOLIN HFA) 108 (90 BASE) MCG/ACT inhaler Inhale 2 puffs into the lungs every 4 (four) hours as needed for wheezing or shortness of breath. 03/31/14  Yes Tyrone Nineyan B Grunz, MD  albuterol (PROVENTIL) (2.5 MG/3ML) 0.083% nebulizer solution Take 6 mLs (5 mg total) by nebulization every 6 (six) hours as needed for wheezing. Patient taking differently: Take 2.5 mg by nebulization every 6 (six) hours as needed for wheezing.  03/27/13  Yes Elpidio AnisShari Upstill, PA-C  Spacer/Aero-Holding Chambers (AEROCHAMBER PLUS WITH MASK) inhaler Use as instructed 03/31/14  Yes Tyrone Nineyan B Grunz, MD   BP 112/63 mmHg  Pulse 94  Temp(Src) 98.8 F (37.1 C) (Oral)  Resp 16  Wt 66.996 kg  SpO2 100%  LMP 12/20/2015 Physical Exam  Constitutional: She appears well-developed and well-nourished. No distress.  HENT:  Head: Normocephalic and atraumatic.  Eyes: Conjunctivae are normal. Pupils are equal, round, and reactive to light.  Neck: Neck supple.  Cardiovascular: Normal rate, regular rhythm, normal heart sounds and intact distal pulses.   Pulmonary/Chest: Effort normal and breath sounds normal. No respiratory distress.  Abdominal: Soft. Bowel sounds are normal. There is tenderness in the suprapubic area. There is no guarding.  Genitourinary:  External genitalia normal Vagina with discharge - minimal blood in vaginal vault Cervix  normal negative for cervical motion tenderness  Adnexa palpated, no masses or negative for tenderness noted Bladder palpated positive for tenderness Uterus palpated no masses or positive for tenderness Otherwise normal female genitalia. RN, Shanda Bumps, served as chaperone during exam.  Musculoskeletal: She exhibits no edema or tenderness.  Lymphadenopathy:    She has no  cervical adenopathy.  Neurological: She is alert.  Skin: Skin is warm and dry. She is not diaphoretic.  Psychiatric: She has a normal mood and affect. Her behavior is normal.  Nursing note and vitals reviewed.   ED Course  Pelvic exam Date/Time: 12/22/2015 6:07 PM Performed by: Anselm Pancoast Authorized by: Harolyn Rutherford C Consent: Verbal consent obtained. Risks and benefits: risks, benefits and alternatives were discussed Consent given by: patient Patient understanding: patient states understanding of the procedure being performed Patient consent: the patient's understanding of the procedure matches consent given Procedure consent: procedure consent matches procedure scheduled Patient identity confirmed: verbally with patient and arm band Local anesthesia used: no Patient sedated: no Patient tolerance: Patient tolerated the procedure well with no immediate complications   (including critical care time) Labs Review Labs Reviewed  WET PREP, GENITAL - Abnormal; Notable for the following:    Clue Cells Wet Prep HPF POC PRESENT (*)    WBC, Wet Prep HPF POC MANY (*)    All other components within normal limits  CBC WITH DIFFERENTIAL/PLATELET - Abnormal; Notable for the following:    WBC 10.8 (*)    Neutro Abs 8.3 (*)    All other components within normal limits  COMPREHENSIVE METABOLIC PANEL - Abnormal; Notable for the following:    Glucose, Bld 109 (*)    All other components within normal limits  URINALYSIS, ROUTINE W REFLEX MICROSCOPIC (NOT AT [facility]) - Abnormal; Notable for the following:    Hgb urine dipstick SMALL (*)    All other components within normal limits  URINE MICROSCOPIC-ADD ON - Abnormal; Notable for the following:    Squamous Epithelial / LPF 0-5 (*)    All other components within normal limits  RAPID HIV SCREEN (HIV 1/2 AB+AG)  HEPATITIS PANEL, ACUTE  I-STAT BETA HCG BLOOD, ED ([facility], [facility], [facility] ONLY)  GC/CHLAMYDIA PROBE AMP ([place]) NOT AT [facility]    Imaging  Review No results found. I have personally reviewed and evaluated these lab results as part of my medical decision-making.   EKG Interpretation None      MDM   Final diagnoses:  Pelvic pain in female  BV (bacterial vaginosis)    Chloe Curtis resents with lower pelvic pain for the last 3 weeks.  Findings and plan of care discussed with Gerhard Munch, MD. Dr. Jeraldine Loots personally evaluated and examined this patient.  Suspect that the patient's pain may possibly be due to a UTI, however, due to the patient's tenderness in the lower pelvis on the pelvic exam, a pelvic ultrasound was ordered. It should be noted that the included hepatitis panel was added due to an employee needle stick exposure, as part of the exposure panel. Upon reevaluation, patient states improvement in her symptoms.  Dr. Jeraldine Loots took over patient care upon the end of my shift. Plan: Review pelvic ultrasound results and, if normal, discharge with prescription for Flagyl and OB/GYN follow-up.  Filed Vitals:   12/22/15 1711 12/22/15 1920  BP: 127/64 112/63  Pulse: 87 94  Temp: 98.1 F (36.7 C) 98.8 F (37.1 C)  TempSrc: Oral Oral  Resp: 18 16  Weight: 66.996 kg   SpO2: 98% 100%  Anselm Pancoast, PA-C 12/22/15 1938  Gerhard Munch, MD 12/22/15 2142

## 2015-12-22 NOTE — ED Notes (Signed)
Pt c/o 3 week history intermittent pelvic pain and cramps. She denies n/v, fevers, bowel/bladder change. She has had heavier than normal vaginal discharge that is white and thick.

## 2015-12-22 NOTE — Discharge Instructions (Signed)
As discussed, with tight identification of an infection it is important that you follow-up with your gynecologist.  Please take all medication as directed, and do not hesitate to return here if you develop new, or concerning changes in your condition.   Bacterial Vaginosis Bacterial vaginosis is a vaginal infection that occurs when the normal balance of bacteria in the vagina is disrupted. It results from an overgrowth of certain bacteria. This is the most common vaginal infection in women of childbearing age. Treatment is important to prevent complications, especially in pregnant women, as it can cause a premature delivery. CAUSES  Bacterial vaginosis is caused by an increase in harmful bacteria that are normally present in smaller amounts in the vagina. Several different kinds of bacteria can cause bacterial vaginosis. However, the reason that the condition develops is not fully understood. RISK FACTORS Certain activities or behaviors can put you at an increased risk of developing bacterial vaginosis, including:  Having a new sex partner or multiple sex partners.  Douching.  Using an intrauterine device (IUD) for contraception. Women do not get bacterial vaginosis from toilet seats, bedding, swimming pools, or contact with objects around them. SIGNS AND SYMPTOMS  Some women with bacterial vaginosis have no signs or symptoms. Common symptoms include:  Grey vaginal discharge.  A fishlike odor with discharge, especially after sexual intercourse.  Itching or burning of the vagina and vulva.  Burning or pain with urination. DIAGNOSIS  Your health care provider will take a medical history and examine the vagina for signs of bacterial vaginosis. A sample of vaginal fluid may be taken. Your health care provider will look at this sample under a microscope to check for bacteria and abnormal cells. A vaginal pH test may also be done.  TREATMENT  Bacterial vaginosis may be treated with  antibiotic medicines. These may be given in the form of a pill or a vaginal cream. A second round of antibiotics may be prescribed if the condition comes back after treatment. Because bacterial vaginosis increases your risk for sexually transmitted diseases, getting treated can help reduce your risk for chlamydia, gonorrhea, HIV, and herpes. HOME CARE INSTRUCTIONS   Only take over-the-counter or prescription medicines as directed by your health care provider.  If antibiotic medicine was prescribed, take it as directed. Make sure you finish it even if you start to feel better.  Tell all sexual partners that you have a vaginal infection. They should see their health care provider and be treated if they have problems, such as a mild rash or itching.  During treatment, it is important that you follow these instructions:  Avoid sexual activity or use condoms correctly.  Do not douche.  Avoid alcohol as directed by your health care provider.  Avoid breastfeeding as directed by your health care provider. SEEK MEDICAL CARE IF:   Your symptoms are not improving after 3 days of treatment.  You have increased discharge or pain.  You have a fever. MAKE SURE YOU:   Understand these instructions.  Will watch your condition.  Will get help right away if you are not doing well or get worse. FOR MORE INFORMATION  Centers for Disease Control and Prevention, Division of STD Prevention: SolutionApps.co.zawww.cdc.gov/std American Sexual Health Association (ASHA): www.ashastd.org    This information is not intended to replace advice given to you by your health care provider. Make sure you discuss any questions you have with your health care provider.   Document Released: 09/15/2005 Document Revised: 10/06/2014 Document Reviewed: 04/27/2013 Elsevier  Interactive Patient Education ©2016 Elsevier Inc. ° °

## 2015-12-23 LAB — HEPATITIS PANEL, ACUTE
HCV Ab: 0.1 s/co ratio (ref 0.0–0.9)
Hep A IgM: NEGATIVE
Hep B C IgM: NEGATIVE
Hepatitis B Surface Ag: NEGATIVE

## 2015-12-24 LAB — GC/CHLAMYDIA PROBE AMP (~~LOC~~) NOT AT ARMC
CHLAMYDIA, DNA PROBE: NEGATIVE
NEISSERIA GONORRHEA: NEGATIVE

## 2016-10-17 ENCOUNTER — Encounter (HOSPITAL_COMMUNITY): Payer: Self-pay | Admitting: *Deleted

## 2016-10-17 DIAGNOSIS — J45909 Unspecified asthma, uncomplicated: Secondary | ICD-10-CM | POA: Insufficient documentation

## 2016-10-17 DIAGNOSIS — Z79899 Other long term (current) drug therapy: Secondary | ICD-10-CM | POA: Insufficient documentation

## 2016-10-17 DIAGNOSIS — J09X2 Influenza due to identified novel influenza A virus with other respiratory manifestations: Secondary | ICD-10-CM | POA: Insufficient documentation

## 2016-10-17 NOTE — ED Triage Notes (Signed)
Generalized body aches since yesterday   With a headache cough chills and sweating  Unknown tyemp.  dec

## 2016-10-18 ENCOUNTER — Emergency Department (HOSPITAL_COMMUNITY)
Admission: EM | Admit: 2016-10-18 | Discharge: 2016-10-18 | Discharge status: Against Medical Advice | Payer: Self-pay | Attending: Emergency Medicine | Admitting: Emergency Medicine

## 2016-10-18 ENCOUNTER — Emergency Department (HOSPITAL_COMMUNITY): Payer: Self-pay

## 2016-10-18 DIAGNOSIS — J101 Influenza due to other identified influenza virus with other respiratory manifestations: Secondary | ICD-10-CM

## 2016-10-18 LAB — COMPREHENSIVE METABOLIC PANEL
ALK PHOS: 56 U/L (ref 38–126)
ALT: 37 U/L (ref 14–54)
AST: 24 U/L (ref 15–41)
Albumin: 4 g/dL (ref 3.5–5.0)
Anion gap: 10 (ref 5–15)
BILIRUBIN TOTAL: 0.8 mg/dL (ref 0.3–1.2)
BUN: 9 mg/dL (ref 6–20)
CALCIUM: 9.2 mg/dL (ref 8.9–10.3)
CO2: 20 mmol/L — ABNORMAL LOW (ref 22–32)
CREATININE: 0.7 mg/dL (ref 0.44–1.00)
Chloride: 105 mmol/L (ref 101–111)
GFR calc non Af Amer: >60 mL/min (ref >60)
Glucose, Bld: 100 mg/dL — ABNORMAL HIGH (ref 65–99)
Potassium: 3.3 mmol/L — ABNORMAL LOW (ref 3.5–5.1)
Sodium: 135 mmol/L (ref 135–145)
Total Protein: 7.1 g/dL (ref 6.5–8.1)

## 2016-10-18 LAB — URINALYSIS, ROUTINE W REFLEX MICROSCOPIC
BILIRUBIN URINE: NEGATIVE
Bacteria, UA: NONE SEEN
Glucose, UA: NEGATIVE mg/dL
HGB URINE DIPSTICK: NEGATIVE
Ketones, ur: 5 mg/dL — AB
NITRITE: NEGATIVE
PROTEIN: NEGATIVE mg/dL
SPECIFIC GRAVITY, URINE: 1.028 (ref 1.005–1.030)
pH: 5 (ref 5.0–8.0)

## 2016-10-18 LAB — CBC WITH DIFFERENTIAL/PLATELET
BASOS ABS: 0 10*3/uL (ref 0.0–0.1)
Basophils Relative: 0 %
EOS PCT: 0 %
Eosinophils Absolute: 0 10*3/uL (ref 0.0–0.7)
HEMATOCRIT: 42 % (ref 36.0–46.0)
HEMOGLOBIN: 14.3 g/dL (ref 12.0–15.0)
LYMPHS ABS: 0.8 10*3/uL (ref 0.7–4.0)
LYMPHS PCT: 8 %
MCH: 29.5 pg (ref 26.0–34.0)
MCHC: 34 g/dL (ref 30.0–36.0)
MCV: 86.6 fL (ref 78.0–100.0)
Monocytes Absolute: 1.3 10*3/uL — ABNORMAL HIGH (ref 0.1–1.0)
Monocytes Relative: 14 %
NEUTROS ABS: 7.5 10*3/uL (ref 1.7–7.7)
Neutrophils Relative %: 78 %
PLATELETS: 162 10*3/uL (ref 150–400)
RBC: 4.85 MIL/uL (ref 3.87–5.11)
RDW: 12.7 % (ref 11.5–15.5)
WBC: 9.7 10*3/uL (ref 4.0–10.5)

## 2016-10-18 LAB — I-STAT CG4 LACTIC ACID, ED: LACTIC ACID, VENOUS: 1.35 mmol/L (ref 0.5–1.9)

## 2016-10-18 LAB — INFLUENZA PANEL BY PCR (TYPE A & B)
Influenza A By PCR: POSITIVE — AB
Influenza B By PCR: NEGATIVE

## 2016-10-18 LAB — PREGNANCY, URINE: PREG TEST UR: NEGATIVE

## 2016-10-18 MED ORDER — ACETAMINOPHEN 325 MG PO TABS
650.0000 mg | ORAL_TABLET | Freq: Once | ORAL | Status: AC
  Administered 2016-10-18: 650 mg via ORAL
  Filled 2016-10-18: qty 2

## 2016-10-18 MED ORDER — SODIUM CHLORIDE 0.9 % IV BOLUS (SEPSIS)
1000.0000 mL | Freq: Once | INTRAVENOUS | Status: AC
  Administered 2016-10-18: 1000 mL via INTRAVENOUS

## 2016-10-18 MED ORDER — ALBUTEROL SULFATE (2.5 MG/3ML) 0.083% IN NEBU
5.0000 mg | INHALATION_SOLUTION | Freq: Once | RESPIRATORY_TRACT | Status: AC
  Administered 2016-10-18: 5 mg via RESPIRATORY_TRACT
  Filled 2016-10-18: qty 6

## 2016-10-18 MED ORDER — POTASSIUM CHLORIDE CRYS ER 20 MEQ PO TBCR
40.0000 meq | EXTENDED_RELEASE_TABLET | Freq: Once | ORAL | Status: AC
Start: 2016-10-18 — End: 2016-10-18
  Administered 2016-10-18: 40 meq via ORAL
  Filled 2016-10-18: qty 2

## 2016-10-18 MED ORDER — OSELTAMIVIR PHOSPHATE 75 MG PO CAPS: 75.0000 mg | ORAL_CAPSULE | Freq: Two times a day (BID) | ORAL | 0 refills | Status: DC

## 2016-10-18 MED ORDER — SODIUM CHLORIDE 0.9 % IV BOLUS (SEPSIS)
500.0000 mL | Freq: Once | INTRAVENOUS | Status: AC
  Administered 2016-10-18: 500 mL via INTRAVENOUS

## 2016-10-18 MED ORDER — IPRATROPIUM BROMIDE 0.02 % IN SOLN
0.5000 mg | Freq: Once | RESPIRATORY_TRACT | Status: AC
  Administered 2016-10-18: 0.5 mg via RESPIRATORY_TRACT
  Filled 2016-10-18: qty 2.5

## 2016-10-18 MED ORDER — IBUPROFEN 800 MG PO TABS
800.0000 mg | ORAL_TABLET | Freq: Once | ORAL | Status: AC
  Administered 2016-10-18: 800 mg via ORAL
  Filled 2016-10-18: qty 1

## 2016-10-18 NOTE — ED Notes (Signed)
ED Provider at bedside. 

## 2016-10-18 NOTE — ED Notes (Signed)
Per PA pt not staying, pt AMA

## 2016-10-18 NOTE — ED Notes (Signed)
I triaged this pt hours ago no change in her condition except her temp has risen.  No advil or tylenol taken today

## 2016-10-18 NOTE — Discharge Instructions (Signed)
1. Medications: Tamiflu, usual home medications 2. Treatment: rest, drink plenty of fluids,  3. Follow Up: Please followup with your primary doctor in 2 days for discussion of your diagnoses and further evaluation after today's visit; if you do not have a primary care doctor use the resource guide provided to find one; Please return to the ER for worsening fever, vomiting, difficulty breathing or other concerns

## 2016-10-18 NOTE — ED Provider Notes (Signed)
[facility]-EMERGENCY DEPT Provider Note   CSN: [id number] Arrival date & time: 10/17/16  2057     History   Chief Complaint Chief Complaint  Patient presents with  . Generalized Body Aches    HPI Chloe Curtis is a 26 y.o. female with a hx of asthma presents to the Emergency Department complaining of gradual, persistent, progressively worsening generalized body aches onset <24 hrs ago.  Associated symptoms include fever, chills, cough, low back pain, myalgias, chest tightness.  Pt reports her mother was diagnosed with influenza B 2 days ago.  No treatments PTA.  No aggravating or alleviating factors  Pt denies CP, SOB, abd pain, N/V/D, weakness, dizziness, syncope.    The history is provided by the patient and medical records. No language interpreter was used.    Past Medical History:  Diagnosis Date  . Abscess and cellulitis    s/p I&D by Dr. Shea Evans PCP [facility])  . Asthma   . Chlamydia infection    x2.  . Teenage mother 03/2010   Preterm labor and delivery @ 35 weeks.    Patient Active Problem List   Diagnosis Date Noted  . Vaginal discharge 01/24/2015  . Healthcare maintenance 08/01/2013  . Contraception management 08/01/2013  . Suprapubic pain 03/02/2013  . External hemorrhoid 10/05/2012  . Menorrhagia 05/26/2012  . Well adolescent visit 02/12/2011  . ASTHMA, EXERCISE INDUCED 11/26/2006    History reviewed. No pertinent surgical history.  OB History    No data available       Home Medications    Prior to Admission medications   Medication Sig Start Date End Date Taking? Authorizing Provider  albuterol (PROVENTIL HFA;VENTOLIN HFA) 108 (90 BASE) MCG/ACT inhaler Inhale 2 puffs into the lungs every 4 (four) hours as needed for wheezing or shortness of breath. 03/31/14   Tyrone Nine, MD  albuterol (PROVENTIL) (2.5 MG/3ML) 0.083% nebulizer solution Take 6 mLs (5 mg total) by nebulization every 6 (six) hours as needed for wheezing. Patient taking  differently: Take 2.5 mg by nebulization every 6 (six) hours as needed for wheezing.  03/27/13   Elpidio Anis, PA-C  metroNIDAZOLE (FLAGYL) 500 MG tablet Take 1 tablet (500 mg total) by mouth 2 (two) times daily. 12/22/15   Gerhard Munch, MD  oseltamivir (TAMIFLU) 75 MG capsule Take 1 capsule (75 mg total) by mouth every 12 (twelve) hours. 10/18/16   Dahlia Client Allie Ousley, PA-C  Spacer/Aero-Holding Chambers (AEROCHAMBER PLUS WITH MASK) inhaler Use as instructed 03/31/14   Tyrone Nine, MD    Family History No family history on file.  Social History Social History  Substance Use Topics  . Smoking status: Never Smoker  . Smokeless tobacco: Never Used  . Alcohol use No     Allergies   Advair diskus [fluticasone-salmeterol]   Review of Systems Review of Systems  Constitutional: Positive for chills, fatigue and fever.  Respiratory: Positive for cough and chest tightness.   Musculoskeletal: Positive for back pain ( low).  All other systems reviewed and are negative.    Physical Exam Updated Vital Signs BP (!) 111/42   Pulse 105   Temp 102.5 F (39.2 C)   Resp 20   Ht 5\' 1"  (1.549 m)   Wt 72.6 kg   SpO2 98%   BMI 30.23 kg/m   Physical Exam  Constitutional: She appears well-developed and well-nourished. No distress.  Awake, alert, nontoxic appearance  HENT:  Head: Normocephalic and atraumatic.  Mouth/Throat: Oropharynx is clear and moist. No oropharyngeal  exudate.  Eyes: Conjunctivae are normal. No scleral icterus.  Neck: Normal range of motion. Neck supple.  Full ROM without pain  Cardiovascular: Regular rhythm and intact distal pulses.  Tachycardia present.   Pulses:      Radial pulses are 2+ on the right side, and 2+ on the left side.  Pulmonary/Chest: Effort normal. No respiratory distress. She has decreased breath sounds. She has no wheezes.  Equal chest expansion  Abdominal: Soft. Bowel sounds are normal. She exhibits no distension and no mass. There is no  tenderness. There is no rebound and no guarding.  Musculoskeletal: Normal range of motion. She exhibits no edema.  Full range of motion of the T-spine and L-spine No midline tenderness to the  T-spine or L-spine Mild tenderness to palpation of the paraspinous muscles of the L-spine  Lymphadenopathy:    She has no cervical adenopathy.  Neurological: She is alert. She has normal reflexes.  Reflex Scores:      Bicep reflexes are 2+ on the right side and 2+ on the left side.      Brachioradialis reflexes are 2+ on the right side and 2+ on the left side.      Patellar reflexes are 2+ on the right side and 2+ on the left side.      Achilles reflexes are 2+ on the right side and 2+ on the left side. Speech is clear and goal oriented Moves extremities without ataxia  Skin: Skin is warm. No rash noted. She is diaphoretic. No erythema. There is pallor.  Skin hot to touch  Psychiatric: She has a normal mood and affect. Her behavior is normal.  Nursing note and vitals reviewed.    ED Treatments / Results  Labs (all labs ordered are listed, but only abnormal results are displayed) Labs Reviewed  CBC WITH DIFFERENTIAL/PLATELET - Abnormal; Notable for the following:       Result Value   Monocytes Absolute 1.3 (*)    All other components within normal limits  COMPREHENSIVE METABOLIC PANEL - Abnormal; Notable for the following:    Potassium 3.3 (*)    CO2 20 (*)    Glucose, Bld 100 (*)    All other components within normal limits  INFLUENZA PANEL BY PCR (TYPE A & B) - Abnormal; Notable for the following:    Influenza A By PCR POSITIVE (*)    All other components within normal limits  URINALYSIS, ROUTINE W REFLEX MICROSCOPIC - Abnormal; Notable for the following:    Color, Urine AMBER (*)    APPearance CLOUDY (*)    Ketones, ur 5 (*)    Leukocytes, UA TRACE (*)    Squamous Epithelial / LPF 6-30 (*)    All other components within normal limits  PREGNANCY, URINE  I-STAT CG4 LACTIC ACID, ED      Radiology Dg Chest 2 View  Result Date: 10/18/2016 CLINICAL DATA:  Cough, fever and weakness. EXAM: CHEST  2 VIEW COMPARISON:  03/27/2013 FINDINGS: The cardiomediastinal contours are normal. Bronchial thickening, unchanged from prior. Mild hyperinflation. Pulmonary vasculature is normal. No consolidation, pleural effusion, or pneumothorax. No acute osseous abnormalities are seen. IMPRESSION: Bronchial thickening and hyperinflation, can be seen with bronchitis or asthma. No pneumonia. Electronically Signed   By: Rubye Oaks M.D.   On: 10/18/2016 03:37    Procedures Procedures (including critical care time)  Medications Ordered in ED Medications  acetaminophen (TYLENOL) tablet 650 mg (650 mg Oral Given 10/18/16 0141)  sodium chloride 0.9 % bolus  1,000 mL (0 mLs Intravenous Stopped 10/18/16 0319)  albuterol (PROVENTIL) (2.5 MG/3ML) 0.083% nebulizer solution 5 mg (5 mg Nebulization Given 10/18/16 0318)  ipratropium (ATROVENT) nebulizer solution 0.5 mg (0.5 mg Nebulization Given 10/18/16 0319)  sodium chloride 0.9 % bolus 1,000 mL (0 mLs Intravenous Stopped 10/18/16 0602)  potassium chloride SA (K-DUR,KLOR-CON) CR tablet 40 mEq (40 mEq Oral Given 10/18/16 0539)  ibuprofen (ADVIL,MOTRIN) tablet 800 mg (800 mg Oral Given 10/18/16 0542)  sodium chloride 0.9 % bolus 500 mL (0 mLs Intravenous Stopped 10/18/16 0814)     Initial Impression / Assessment and Plan / ED Course  I have reviewed the triage vital signs and the nursing notes.  Pertinent labs & imaging results that were available during my care of the patient were reviewed by me and considered in my medical decision making (see chart for details).  Clinical Course as of Oct 18 837  Sat Oct 18, 2016  [id number] Orthostatic vital signs reveal persistent tachycardia to 125. Patient without hypotension or near syncope. I discussed the risk of discharge with persistent tachycardia and my concern about her influenza diagnosis. She's had multiple  liters of fluid and is no longer febrile. Patient wishes to be discharged home at this time. We will treat her with Tamiflu. She has capacity to make this decision. Risk concerning potential mortality and morbidity have been discussed.  Patient understands that she may return at any time for further treatment. Discussed reasons to return including persistent tachycardia, lightheadedness, near syncope, persistent fevers, vomiting or just generally feeling worse.  [HM]    Clinical Course User Index [HM] Dierdre ForthHannah Ventura Leggitt, PA-C    Patient with influenza, febrile. She has received 2 L of fluid. Albuterol has improved her breathing significantly. Potassium given to correct her hypokalemia. No evidence of UTI on urinalysis. Patient has been tachycardic throughout her time here in the emergency department. Initially febrile but continued to have tachycardia after fever resolved. Normal lactic acid. Doubt sepsis. No red flags for DVT or PE.  The patient my concern about her influenza and persistent tachycardia in spite her being young and healthy. I have recommended admission. Patient reports she has children at home and cannot be admitted.  We discussed the nature and purpose, risks and benefits, as well as, the alternatives of treatment. Time was given to allow the opportunity to ask questions and consider their options, and after the discussion, the patient decided to refuse the offerred treatment. The patient was informed that refusal could lead to, but was not limited to, death, permanent disability, or severe pain. I asked significant others to dissuade them without success. Prior to refusing, I determined that the patient had the capacity to make their decision and understood the consequences of that decision. After refusal, I made every reasonable opportunity to treat them to the best of my ability.  Patient was given Tamiflu. The patient was notified that they may return to the emergency department at  any time for further treatment.  I encouraged her to return quickly if her symptoms worsened and her fevers were persistent or she had any concerns at all.  The patient was discussed with and seen by Dr. Mora Bellmanni who agrees with the treatment plan.     Final Clinical Impressions(s) / ED Diagnoses   Final diagnoses:  Influenza A    New Prescriptions Discharge Medication List as of 10/18/2016  7:16 AM    START taking these medications   Details  oseltamivir (TAMIFLU) 75 MG capsule Take  1 capsule (75 mg total) by mouth every 12 (twelve) hours., Starting Sat 10/18/2016, [employer] Jasara Corrigan, PA-C 10/18/16 4401    Tomasita Crumble, MD 10/18/16 1255

## 2016-10-18 NOTE — ED Notes (Signed)
Patient is resting comfortably. 

## 2017-02-20 ENCOUNTER — Encounter (HOSPITAL_COMMUNITY): Payer: Self-pay | Admitting: *Deleted

## 2017-02-20 ENCOUNTER — Emergency Department (HOSPITAL_COMMUNITY)
Admission: EM | Admit: 2017-02-20 | Discharge: 2017-02-20 | Disposition: A | Discharge status: Home / Self Care | Payer: Medicaid Other | Attending: Emergency Medicine | Admitting: Emergency Medicine

## 2017-02-20 DIAGNOSIS — Z79899 Other long term (current) drug therapy: Secondary | ICD-10-CM | POA: Insufficient documentation

## 2017-02-20 DIAGNOSIS — B9689 Other specified bacterial agents as the cause of diseases classified elsewhere: Secondary | ICD-10-CM | POA: Insufficient documentation

## 2017-02-20 DIAGNOSIS — N760 Acute vaginitis: Secondary | ICD-10-CM | POA: Insufficient documentation

## 2017-02-20 DIAGNOSIS — J45909 Unspecified asthma, uncomplicated: Secondary | ICD-10-CM | POA: Insufficient documentation

## 2017-02-20 LAB — URINALYSIS, ROUTINE W REFLEX MICROSCOPIC
Bacteria, UA: NONE SEEN
Bilirubin Urine: NEGATIVE
GLUCOSE, UA: NEGATIVE mg/dL
KETONES UR: NEGATIVE mg/dL
Leukocytes, UA: NEGATIVE
NITRITE: NEGATIVE
Protein, ur: NEGATIVE mg/dL
Specific Gravity, Urine: 1.025 (ref 1.005–1.030)
pH: 6 (ref 5.0–8.0)

## 2017-02-20 LAB — WET PREP, GENITAL
Sperm: NONE SEEN
Trich, Wet Prep: NONE SEEN
Yeast Wet Prep HPF POC: NONE SEEN

## 2017-02-20 LAB — POC URINE PREG, ED: Preg Test, Ur: NEGATIVE

## 2017-02-20 MED ORDER — METRONIDAZOLE 500 MG PO TABS: 500.0000 mg | ORAL_TABLET | Freq: Two times a day (BID) | ORAL | 0 refills | Status: DC

## 2017-02-20 NOTE — ED Triage Notes (Signed)
Pt states she wants to be checked for an std.  States she heard "stuff" about her sexual partner and she is having lower abdominal cramping and thick, vaginal discharge.  Pt laughing in triage, constantly on cell phone.  Does not appear in any great distress.

## 2017-02-20 NOTE — ED Notes (Signed)
Pelvic exam in process at this time.

## 2017-02-20 NOTE — ED Provider Notes (Signed)
[facility]EMER[facility]Y DEPT Provider Note   CSN: [id number] Arrival date & time: 02/20/17  0930     History   Chief Complaint Chief Complaint  Patient presents with  . Vaginal Discharge    HPI Chloe Curtis is a 26 y.o. female with a several week h/o thicker than normal white discharge and 1 day h/o intermittent suprapubic cramping.   Pt reports that she had unprotected sex 3 day ago and is concerned that she might have an STD.  She is not on any birth control, and her period started 3 days ago.  She reports a discharge without odor, and denies N/V, itching, urinary urgency, dysuria, or fevers/chills. She just started having regular periods earlier this year, but does not normally have menstral cramps. She took some ibuprofen but is not sure if it worked because she fell asleep.   She has a h/o chlamydia and BV. She had not had any abd surgeries. She cannot reproduce the pain, and nothing makes it better or worse.  HPI  Past Medical History:  Diagnosis Date  . Abscess and cellulitis    s/p I&D by Dr. Shea EvansNeal/previous PCP [facility])  . Asthma   . Chlamydia infection    x2.  . Teenage mother 03/2010   Preterm labor and delivery @ 35 weeks.    Patient Active Problem List   Diagnosis Date Noted  . Vaginal discharge 01/24/2015  . Healthcare maintenance 08/01/2013  . Contraception management 08/01/2013  . Suprapubic pain 03/02/2013  . External hemorrhoid 10/05/2012  . Menorrhagia 05/26/2012  . Well adolescent visit 02/12/2011  . ASTHMA, EXERCISE INDUCED 11/26/2006    History reviewed. No pertinent surgical history.  OB History    No data available       Home Medications    Prior to Admission medications   Medication Sig Start Date End Date Taking? Authorizing Provider  albuterol (PROVENTIL HFA;VENTOLIN HFA) 108 (90 BASE) MCG/ACT inhaler Inhale 2 puffs into the lungs every 4 (four) hours as needed for wheezing or shortness of breath. 03/31/14   Tyrone NineGrunz, Ryan B, MD    albuterol (PROVENTIL) (2.5 MG/3ML) 0.083% nebulizer solution Take 6 mLs (5 mg total) by nebulization every 6 (six) hours as needed for wheezing. Patient taking differently: Take 2.5 mg by nebulization every 6 (six) hours as needed for wheezing.  03/27/13   Elpidio AnisUpstill, Shari, PA-C  metroNIDAZOLE (FLAGYL) 500 MG tablet Take 1 tablet (500 mg total) by mouth 2 (two) times daily. 12/22/15   Gerhard MunchLockwood, Robert, MD  oseltamivir (TAMIFLU) 75 MG capsule Take 1 capsule (75 mg total) by mouth every 12 (twelve) hours. 10/18/16   Muthersbaugh, Dahlia ClientHannah, PA-C  Spacer/Aero-Holding Chambers (AEROCHAMBER PLUS WITH MASK) inhaler Use as instructed 03/31/14   Tyrone NineGrunz, Ryan B, MD    Family History No family history on file.  Social History Social History  Substance Use Topics  . Smoking status: Never Smoker  . Smokeless tobacco: Never Used  . Alcohol use No     Allergies   Advair diskus [fluticasone-salmeterol]   Review of Systems Review of Systems  Constitutional: Negative for appetite change, chills and fever.  Respiratory: Negative for cough and shortness of breath.   Cardiovascular: Negative for chest pain.  Gastrointestinal: Negative for abdominal distention, constipation, diarrhea, nausea and vomiting. Abdominal pain: suprapubic cramping.  Genitourinary: Positive for pelvic pain (suprapubic cramping) and vaginal discharge. Negative for difficulty urinating, dysuria, flank pain and frequency.  Skin: Negative for rash.     Physical Exam Updated Vital Signs  BP 112/63 (BP Location: Left Arm)   Pulse 74   Temp 97.8 F (36.6 C) (Oral)   Resp 18   Ht 5\' 1"  (1.549 m)   Wt 69.9 kg (154 lb)   LMP 02/17/2017   SpO2 99%   BMI 29.10 kg/m   Physical Exam  Constitutional: She is oriented to person, place, and time. She appears well-developed and well-nourished. No distress.  HENT:  Head: Normocephalic and atraumatic.  Cardiovascular: Normal rate, regular rhythm, normal heart sounds and intact distal  pulses.   Pulmonary/Chest: Effort normal and breath sounds normal.  Abdominal: Soft. Bowel sounds are normal. She exhibits no distension. There is no tenderness. There is no rebound and no guarding.  Genitourinary: Uterus normal. Pelvic exam was performed with patient supine. There is no rash, tenderness or lesion on the right labia. There is no rash, tenderness or lesion on the left labia. Cervix exhibits no motion tenderness, no discharge and no friability. Right adnexum displays no mass and no tenderness. Left adnexum displays no mass and no tenderness. No tenderness in the vagina. No vaginal discharge found.  Genitourinary Comments: Slight bleeding from the cervix consistent with menstrual bleeding  Neurological: She is alert and oriented to person, place, and time.  Skin: Skin is warm and dry. She is not diaphoretic.  Psychiatric: She has a normal mood and affect.     ED Treatments / Results  Labs (all labs ordered are listed, but only abnormal results are displayed) Labs Reviewed  WET PREP, GENITAL - Abnormal; Notable for the following:       Result Value   Clue Cells Wet Prep HPF POC PRESENT (*)    WBC, Wet Prep HPF POC MODERATE (*)    All other components within normal limits  URINALYSIS, ROUTINE W REFLEX MICROSCOPIC  RPR  HIV ANTIBODY (ROUTINE TESTING)  POC URINE PREG, ED  GC/CHLAMYDIA PROBE AMP ([place]) NOT AT [facility]    EKG  EKG Interpretation None       Radiology No results found.  Procedures Procedures (including critical care time)  Medications Ordered in ED Medications - No data to display   Initial Impression / Assessment and Plan / ED Course  I have reviewed the triage vital signs and the nursing notes.  Pertinent labs & imaging results that were available during my care of the patient were reviewed by me and considered in my medical decision making (see chart for details).     Pt appears very comfortable through H&P and physical. Giggling and  joking with her friend throughout the genital exam. No cervical or adnexal tenderness, and benign progress/history I have low concern for torsion at this time.  Wet prep shows clue cells and WBC- will tx for BV. No evidence of UTI. Pregnancy negative. Discussed causes of BV. Will have pt f/u with [facility] if sxs do not improve. F/u in ED if fever/chills, vomiting or other dangerous signs. Discussed using hot pas and motrin to treat abd cramping.   Final Clinical Impressions(s) / ED Diagnoses   Final diagnoses:  BV (bacterial vaginosis)    New Prescriptions New Prescriptions   No medications on file     Alveria Apley, PA-C 02/20/17 1316    Margarita Grizzle, MD 02/20/17 [phone]

## 2017-02-21 LAB — RPR: RPR: NONREACTIVE

## 2017-02-21 LAB — HIV ANTIBODY (ROUTINE TESTING W REFLEX): HIV SCREEN 4TH GENERATION: NONREACTIVE

## 2017-02-24 LAB — GC/CHLAMYDIA PROBE AMP (~~LOC~~) NOT AT ARMC
CHLAMYDIA, DNA PROBE: NEGATIVE
NEISSERIA GONORRHEA: NEGATIVE

## 2017-04-15 ENCOUNTER — Emergency Department (HOSPITAL_COMMUNITY)
Admission: EM | Admit: 2017-04-15 | Discharge: 2017-04-15 | Disposition: A | Discharge status: Home / Self Care | Payer: Medicaid Other | Attending: Emergency Medicine | Admitting: Emergency Medicine

## 2017-04-15 ENCOUNTER — Encounter (HOSPITAL_COMMUNITY): Payer: Self-pay | Admitting: Emergency Medicine

## 2017-04-15 DIAGNOSIS — J45909 Unspecified asthma, uncomplicated: Secondary | ICD-10-CM | POA: Insufficient documentation

## 2017-04-15 DIAGNOSIS — Z79899 Other long term (current) drug therapy: Secondary | ICD-10-CM | POA: Insufficient documentation

## 2017-04-15 DIAGNOSIS — H6693 Otitis media, unspecified, bilateral: Secondary | ICD-10-CM | POA: Insufficient documentation

## 2017-04-15 DIAGNOSIS — H6690 Otitis media, unspecified, unspecified ear: Secondary | ICD-10-CM

## 2017-04-15 DIAGNOSIS — H60393 Other infective otitis externa, bilateral: Secondary | ICD-10-CM | POA: Insufficient documentation

## 2017-04-15 MED ORDER — CIPROFLOXACIN-DEXAMETHASONE 0.3-0.1 % OT SUSP
4.0000 [drp] | Freq: Two times a day (BID) | OTIC | Status: DC
  Administered 2017-04-15: 4 [drp] via OTIC
  Filled 2017-04-15: qty 7.5

## 2017-04-15 MED ORDER — AMOXICILLIN 500 MG PO CAPS: 500.0000 mg | ORAL_CAPSULE | Freq: Three times a day (TID) | ORAL | 0 refills | Status: DC

## 2017-04-15 NOTE — ED Provider Notes (Signed)
[facility]-EMERGENCY DEPT Provider Note   CSN: [id number] Arrival date & time: 04/15/17  1425  By signing my name below, I, Cynda AcresHailei Fulton, attest that this documentation has been prepared under the direction and in the presence of Everet Flagg, PA-C. Electronically Signed: Cynda AcresHailei Fulton, Scribe. 04/15/17. 3:10 PM.  History   Chief Complaint Chief Complaint  Patient presents with  . Otalgia    bilateral    HPI Comments: Chloe Curtis is a 26 y.o. female with no pertinent past medical history, who presents to the Emergency Department complaining of persistent bilateral ear pain that began several days ago. Patient reports gradually worsening in the bilateral ear pain. Patient states they both "clogged up" at the same time. Patient reports associated hearing loss and left ear drainage. No medications taken prior to arrival. Patient reports flushing her ears with no relief. Patient denies any recent swimming. Patient denies any fever, chills, nausea, vomiting, or any additional symptoms.   The history is provided by the patient. No language interpreter was used.    Past Medical History:  Diagnosis Date  . Abscess and cellulitis    s/p I&D by Dr. Shea EvansNeal/previous PCP [facility])  . Asthma   . Chlamydia infection    x2.  . Teenage mother 03/2010   Preterm labor and delivery @ 35 weeks.    Patient Active Problem List   Diagnosis Date Noted  . Vaginal discharge 01/24/2015  . Healthcare maintenance 08/01/2013  . Contraception management 08/01/2013  . Suprapubic pain 03/02/2013  . External hemorrhoid 10/05/2012  . Menorrhagia 05/26/2012  . Well adolescent visit 02/12/2011  . ASTHMA, EXERCISE INDUCED 11/26/2006    History reviewed. No pertinent surgical history.  OB History    No data available       Home Medications    Prior to Admission medications   Medication Sig Start Date End Date Taking? Authorizing Provider  albuterol (PROVENTIL HFA;VENTOLIN HFA) 108 (90  BASE) MCG/ACT inhaler Inhale 2 puffs into the lungs every 4 (four) hours as needed for wheezing or shortness of breath. 03/31/14   Tyrone NineGrunz, Ryan B, MD  albuterol (PROVENTIL) (2.5 MG/3ML) 0.083% nebulizer solution Take 6 mLs (5 mg total) by nebulization every 6 (six) hours as needed for wheezing. Patient taking differently: Take 2.5 mg by nebulization every 6 (six) hours as needed for wheezing.  03/27/13   Elpidio AnisUpstill, Shari, PA-C  metroNIDAZOLE (FLAGYL) 500 MG tablet Take 1 tablet (500 mg total) by mouth 2 (two) times daily. 02/20/17   Jamarrius Salay, Lemont Fillersatyana, PA-C  oseltamivir (TAMIFLU) 75 MG capsule Take 1 capsule (75 mg total) by mouth every 12 (twelve) hours. 10/18/16   Muthersbaugh, Dahlia ClientHannah, PA-C  Spacer/Aero-Holding Chambers (AEROCHAMBER PLUS WITH MASK) inhaler Use as instructed 03/31/14   Tyrone NineGrunz, Ryan B, MD    Family History History reviewed. No pertinent family history.  Social History Social History  Substance Use Topics  . Smoking status: Never Smoker  . Smokeless tobacco: Never Used  . Alcohol use No     Allergies   Advair diskus [fluticasone-salmeterol]   Review of Systems Review of Systems  Constitutional: Negative for chills and fever.  HENT: Positive for ear discharge (left), ear pain (bilateral) and hearing loss.   Gastrointestinal: Negative for nausea and vomiting.  Neurological: Positive for headaches.  All other systems reviewed and are negative.    Physical Exam Updated Vital Signs BP 119/69   Pulse 97   Temp 98.1 F (36.7 C) (Oral)   Resp 18   LMP 03/28/2017  SpO2 100%   Physical Exam  Constitutional: She is oriented to person, place, and time. She appears well-developed and well-nourished.  HENT:  Head: Normocephalic and atraumatic.  Mouth/Throat: Oropharynx is clear and moist. No oropharyngeal exudate.  Left external ear is normal. Ear canal edematous with exudate. TM normal. Right ear canal slightly edematous, no exudate, right TM is erythematous, purulence  noted behind TM.  Eyes: Pupils are equal, round, and reactive to light. EOM are normal.  Neck: Normal range of motion. Neck supple.  Cardiovascular: Normal rate and regular rhythm.   Pulmonary/Chest: Effort normal and breath sounds normal.  Musculoskeletal: Normal range of motion.  Neurological: She is alert and oriented to person, place, and time.  Skin: Skin is warm and dry.  Psychiatric: She has a normal mood and affect.  Nursing note and vitals reviewed.    ED Treatments / Results  DIAGNOSTIC STUDIES: Oxygen Saturation is 100% on RA, normal by my interpretation.    COORDINATION OF CARE: 3:10 PM Discussed treatment plan with pt at bedside and pt agreed to plan, which includes antibiotic ear drops and oral antibiotics.   Labs (all labs ordered are listed, but only abnormal results are displayed) Labs Reviewed - No data to display  EKG  EKG Interpretation None       Radiology No results found.  Procedures Procedures (including critical care time)  Medications Ordered in ED Medications - No data to display   Initial Impression / Assessment and Plan / ED Course  I have reviewed the triage vital signs and the nursing notes.  Pertinent labs & imaging results that were available during my care of the patient were reviewed by me and considered in my medical decision making (see chart for details).     Patient emergency department with bilateral ear pain. Based on exam, left ear appears to have otitis externa. Right ear canal is erythematous as well, however there is no exudate. Her TM on the right ear is erythematous, bulging, with purulence behind TM. Question may be early otitis media. We will cover her with oral antibiotics as well as Ciprodex. Ibuprofen Tylenol for pain. Follow-up with family doctor as needed.  Vitals:   04/15/17 1438 04/15/17 1441  BP:  119/69  Pulse: 97   Resp: 18   Temp: 98.1 F (36.7 C)   TempSrc: Oral   SpO2: 100%      Final Clinical  Impressions(s) / ED Diagnoses   Final diagnoses:  Acute otitis media, unspecified otitis media type  Acute infective otitis externa, bilateral    New Prescriptions New Prescriptions   AMOXICILLIN (AMOXIL) 500 MG CAPSULE    Take 1 capsule (500 mg total) by mouth 3 (three) times daily.   I personally performed the services described in this documentation, which was scribed in my presence. The recorded information has been reviewed and is accurate.     Jaynie Crumble, PA-C 04/15/17 1519    Derwood Kaplan, MD 04/16/17 [phone]

## 2017-04-15 NOTE — ED Triage Notes (Signed)
Pt co bil otalgia. sts unable to hear well . Reports drainage out of both ears.

## 2017-04-15 NOTE — Discharge Instructions (Signed)
Apply 4 drops of ciprodex in each ear. Amoxil as prescribed until all gone. Ibuprofen for pain. Follow up with family doctor as needed.

## 2017-04-22 ENCOUNTER — Other Ambulatory Visit: Payer: Self-pay | Admitting: *Deleted

## 2017-04-23 ENCOUNTER — Telehealth: Payer: Self-pay | Admitting: *Deleted

## 2017-04-23 ENCOUNTER — Encounter: Payer: Self-pay | Admitting: *Deleted

## 2017-04-23 NOTE — Telephone Encounter (Signed)
-----   Message from Tillman SersAngela C Riccio, DO sent at 04/23/2017  2:02 PM EDT ----- Regarding: refill request for flagyl declined  Hasn't been seen in our clinic in 2 years, unsure why she needs Flagyl but if she's having vaginal discharge or other concerns she needs to be seen.

## 2017-04-23 NOTE — Telephone Encounter (Signed)
Tried to call patient but phone only rang and there was no voicemail.  Will try calling again tomorrow, if still no answer will mail a letter. If patient calls back please an appointment for medication refill. Analisa Sledd,CMA

## 2017-04-24 NOTE — Telephone Encounter (Signed)
Letter mailed to patient. Jazmin Hartsell,CMA  

## 2017-04-25 ENCOUNTER — Encounter (HOSPITAL_COMMUNITY): Payer: Self-pay

## 2017-04-25 ENCOUNTER — Emergency Department (HOSPITAL_COMMUNITY)
Admission: EM | Admit: 2017-04-25 | Discharge: 2017-04-26 | Disposition: A | Discharge status: Home / Self Care | Payer: Medicaid Other | Attending: Emergency Medicine | Admitting: Emergency Medicine

## 2017-04-25 DIAGNOSIS — B9689 Other specified bacterial agents as the cause of diseases classified elsewhere: Secondary | ICD-10-CM

## 2017-04-25 DIAGNOSIS — Z79899 Other long term (current) drug therapy: Secondary | ICD-10-CM | POA: Insufficient documentation

## 2017-04-25 DIAGNOSIS — N760 Acute vaginitis: Secondary | ICD-10-CM | POA: Insufficient documentation

## 2017-04-25 DIAGNOSIS — J45990 Exercise induced bronchospasm: Secondary | ICD-10-CM | POA: Insufficient documentation

## 2017-04-25 NOTE — ED Triage Notes (Signed)
Pt reports yellow vaginal discharge and abdominal cramping for two weeks.

## 2017-04-26 LAB — URINALYSIS, ROUTINE W REFLEX MICROSCOPIC
Bacteria, UA: NONE SEEN
Bilirubin Urine: NEGATIVE
Glucose, UA: NEGATIVE mg/dL
Ketones, ur: NEGATIVE mg/dL
LEUKOCYTES UA: NEGATIVE
Nitrite: NEGATIVE
Protein, ur: NEGATIVE mg/dL
SPECIFIC GRAVITY, URINE: 1.029 (ref 1.005–1.030)
pH: 5 (ref 5.0–8.0)

## 2017-04-26 LAB — WET PREP, GENITAL
SPERM: NONE SEEN
Trich, Wet Prep: NONE SEEN
YEAST WET PREP: NONE SEEN

## 2017-04-26 LAB — PREGNANCY, URINE: PREG TEST UR: NEGATIVE

## 2017-04-26 MED ORDER — METRONIDAZOLE 500 MG PO TABS: 500.0000 mg | ORAL_TABLET | Freq: Two times a day (BID) | ORAL | 0 refills | Status: DC

## 2017-04-26 MED ORDER — NAPROXEN 250 MG PO TABS
500.0000 mg | ORAL_TABLET | Freq: Once | ORAL | Status: AC
  Administered 2017-04-26: 500 mg via ORAL
  Filled 2017-04-26: qty 2

## 2017-04-26 NOTE — ED Provider Notes (Signed)
[facility]Y [facility]PT Provider Note   CSN: [id number] Arrival date & time: 04/25/17  2247     History   Chief Complaint Chief Complaint  Patient presents with  . Vaginal Discharge    HPI Chloe Curtis is a 26 y.o. female.  26 year old female history of chlamydia and asthma presents to the emergency department for vaginal discharge 2 weeks. She reports consistent discharge which has been off white and thick. She has also been experiencing some lower pelvic cramping. She did finish her menstrual cycle 2 days ago. She states that her menstrual cycle is typically irregular. She has taken Tylenol for symptoms with some relief. No fever, N/V, dysuria, hematuria. She reports intercourse with 2 sexual partners in the past 6 months; encounter with most recent partner was unprotected.      Past Medical History:  Diagnosis Date  . Abscess and cellulitis    s/p I&D by Dr. Shea EvansNeal/previous PCP Alvia Grove(Bonnie Burnham)  . Asthma   . Chlamydia infection    x2.  . Teenage mother 03/2010   Preterm labor and delivery @ 35 weeks.    Patient Active Problem List   Diagnosis Date Noted  . Vaginal discharge 01/24/2015  . Healthcare maintenance 08/01/2013  . Contraception management 08/01/2013  . Suprapubic pain 03/02/2013  . External hemorrhoid 10/05/2012  . Menorrhagia 05/26/2012  . Well adolescent visit 02/12/2011  . ASTHMA, EXERCISE INDUCED 11/26/2006    History reviewed. No pertinent surgical history.  OB History    No data available       Home Medications    Prior to Admission medications   Medication Sig Start Date End Date Taking? Authorizing Provider  albuterol (PROVENTIL HFA;VENTOLIN HFA) 108 (90 BASE) MCG/ACT inhaler Inhale 2 puffs into the lungs every 4 (four) hours as needed for wheezing or shortness of breath. 03/31/14   Tyrone NineGrunz, Ryan B, MD  albuterol (PROVENTIL) (2.5 MG/3ML) 0.083% nebulizer solution Take 6 mLs (5 mg total) by nebulization every 6 (six) hours as needed for  wheezing. Patient not taking: Reported on 04/15/2017 03/27/13   Elpidio AnisUpstill, Shari, PA-C  amoxicillin (AMOXIL) 500 MG capsule Take 1 capsule (500 mg total) by mouth 3 (three) times daily. 04/15/17   Kirichenko, Tatyana, PA-C  metroNIDAZOLE (FLAGYL) 500 MG tablet Take 1 tablet (500 mg total) by mouth 2 (two) times daily. 04/26/17   Antony MaduraHumes, Clarissa Laird, PA-C  oseltamivir (TAMIFLU) 75 MG capsule Take 1 capsule (75 mg total) by mouth every 12 (twelve) hours. Patient not taking: Reported on 04/15/2017 10/18/16   Muthersbaugh, Dahlia ClientHannah, PA-C  OVER THE COUNTER MEDICATION Place 2 drops in ear(s) daily as needed (pain).    [provider]  Spacer/Aero-Holding Chambers (AEROCHAMBER PLUS WITH MASK) inhaler Use as instructed Patient not taking: Reported on 04/15/2017 03/31/14   Tyrone NineGrunz, Ryan B, MD    Family History No family history on file.  Social History Social History  Substance Use Topics  . Smoking status: Never Smoker  . Smokeless tobacco: Never Used  . Alcohol use No     Allergies   Advair diskus [fluticasone-salmeterol]   Review of Systems Review of Systems Ten systems reviewed and are negative for acute change, except as noted in the HPI.    Physical Exam Updated Vital Signs BP 121/83   Pulse 92   Temp 98.7 F (37.1 C) (Oral)   Resp 16   LMP 03/24/2017   SpO2 99%   Physical Exam  Constitutional: She is oriented to person, place, and time. She appears well-developed and  well-nourished. No distress.  Nontoxic and in NAD  HENT:  Head: Normocephalic and atraumatic.  Eyes: Conjunctivae and EOM are normal. No scleral icterus.  Neck: Normal range of motion.  Pulmonary/Chest: Effort normal. No respiratory distress.  Respirations even and unlabored  Genitourinary: There is no rash, tenderness, lesion or injury on the right labia. There is no rash, tenderness, lesion or injury on the left labia. Uterus is tender (mild, "pressure"). Cervix exhibits friability (minimal). Cervix exhibits no  motion tenderness. Right adnexum displays no mass and no tenderness. Left adnexum displays no mass and no tenderness. There is bleeding (scant spotting) in the vagina. No foreign body in the vagina. No signs of injury around the vagina. Vaginal discharge (pink, thick discharge) found.  Musculoskeletal: Normal range of motion.  Neurological: She is alert and oriented to person, place, and time. She exhibits normal muscle tone. Coordination normal.  Ambulatory with steady gait.  Skin: Skin is warm and dry. No rash noted. She is not diaphoretic. No erythema. No pallor.  Psychiatric: She has a normal mood and affect. Her behavior is normal.  Nursing note and vitals reviewed.    ED Treatments / Results  Labs (all labs ordered are listed, but only abnormal results are displayed) Labs Reviewed  WET PREP, GENITAL - Abnormal; Notable for the following:       Result Value   Clue Cells Wet Prep HPF POC PRESENT (*)    WBC, Wet Prep HPF POC MANY (*)    All other components within normal limits  URINALYSIS, ROUTINE W REFLEX MICROSCOPIC - Abnormal; Notable for the following:    APPearance HAZY (*)    Hgb urine dipstick MODERATE (*)    Squamous Epithelial / LPF 0-5 (*)    All other components within normal limits  PREGNANCY, URINE  GC/CHLAMYDIA PROBE AMP ([place]) NOT [facility]    EKG  EKG Interpretation None       Radiology No results found.  Procedures Procedures (including critical care time)  Medications Ordered in ED Medications  naproxen (NAPROSYN) tablet 500 mg (500 mg Oral Given 04/26/17 0044)     Initial Impression / Assessment and Plan / ED Course  I have reviewed the triage vital signs and the nursing notes.  Pertinent labs & imaging results that were available during my care of the patient were reviewed by me and considered in my medical decision making (see chart for details).     Patient presents for vaginal discharge 2 weeks. She reports unprotected sexual  intercourse with a new partner recently. Patient with white blood cells on wet prep. She is also found to have bacterial vaginosis. UA negative for UTI. Pregnancy negative. No concern for ectopic. No distinct adnexal tenderness on exam. No cervical motion tenderness. Patient afebrile. Lower suspicion for STD currently. Further doubt TOA and ovarian torsion. I have advised the patient follow-up on her gonorrhea and chlamydia tests in 2 days. Will place on Flagyl in the interim for management of bacterial vaginosis. Return precautions discussed and provided. Patient discharged in stable condition with no unaddressed concerns.   Final Clinical Impressions(s) / ED Diagnoses   Final diagnoses:  BV (bacterial vaginosis)    New Prescriptions Discharge Medication List as of 04/26/2017  1:35 AM       Antony MaduraHumes, Scotty Pinder, PA-C 04/26/17 [id number]    Tilden Fossaees, Elizabeth, MD 04/26/17 1601

## 2017-04-26 NOTE — Discharge Instructions (Signed)
Take Flagyl until finished. Do not drink alcohol while taking this medication as it will cause you to violently vomit. Follow up on your STD tests. You should be contacted if your results are positive. Follow up with an OBGYN or primary care doctor for persistent symptoms. Take 600mg  ibuprofen every 6 hours for pain/cramping. Return, as needed, for new or concerning symptoms.

## 2017-04-27 LAB — GC/CHLAMYDIA PROBE AMP (~~LOC~~) NOT AT ARMC
CHLAMYDIA, DNA PROBE: POSITIVE — AB
NEISSERIA GONORRHEA: NEGATIVE

## 2017-04-28 ENCOUNTER — Telehealth: Payer: Self-pay | Admitting: Medical

## 2017-04-28 DIAGNOSIS — A749 Chlamydial infection, unspecified: Secondary | ICD-10-CM

## 2017-04-28 MED ORDER — AZITHROMYCIN 250 MG PO TABS: 1000.0000 mg | ORAL_TABLET | Freq: Once | ORAL | 0 refills | Status: AC

## 2017-04-28 NOTE — Telephone Encounter (Addendum)
[place] E [place] tested positive for  Chlamydia. Patient was called by RN and allergies and pharmacy confirmed. Rx sent to pharmacy of choice.   Marny LowensteinWenzel, Julie N, PA-C 04/28/2017 4:26 PM      ----- Message from Kathe BectonLori S Berdik, RN sent at 04/28/2017  4:22 PM EDT ----- This patient tested positive for chlamydia  She has allergy to "Advair" I have informed the patient of her results and confirmed her pharmacy is correct in her chart. Please send Rx.   Thank you,   Kathe BectonBerdik, Lori S, RN   [facility]

## 2017-06-30 ENCOUNTER — Encounter (HOSPITAL_COMMUNITY): Payer: Self-pay | Admitting: Emergency Medicine

## 2017-06-30 DIAGNOSIS — Z79899 Other long term (current) drug therapy: Secondary | ICD-10-CM | POA: Insufficient documentation

## 2017-06-30 DIAGNOSIS — J45909 Unspecified asthma, uncomplicated: Secondary | ICD-10-CM | POA: Insufficient documentation

## 2017-06-30 DIAGNOSIS — N760 Acute vaginitis: Secondary | ICD-10-CM | POA: Insufficient documentation

## 2017-06-30 NOTE — ED Triage Notes (Signed)
Pt reports vaginal discharge X 3 weeks. Pt states her partner told her he had an STD so she is coming here to be tested.

## 2017-07-01 ENCOUNTER — Emergency Department (HOSPITAL_COMMUNITY)
Admission: EM | Admit: 2017-07-01 | Discharge: 2017-07-01 | Disposition: A | Discharge status: Home / Self Care | Payer: Self-pay | Attending: Emergency Medicine | Admitting: Emergency Medicine

## 2017-07-01 DIAGNOSIS — N898 Other specified noninflammatory disorders of vagina: Secondary | ICD-10-CM

## 2017-07-01 DIAGNOSIS — N760 Acute vaginitis: Secondary | ICD-10-CM

## 2017-07-01 DIAGNOSIS — B9689 Other specified bacterial agents as the cause of diseases classified elsewhere: Secondary | ICD-10-CM

## 2017-07-01 LAB — PREGNANCY, URINE: PREG TEST UR: NEGATIVE

## 2017-07-01 LAB — URINALYSIS, ROUTINE W REFLEX MICROSCOPIC
BILIRUBIN URINE: NEGATIVE
GLUCOSE, UA: NEGATIVE mg/dL
HGB URINE DIPSTICK: NEGATIVE
Ketones, ur: NEGATIVE mg/dL
Leukocytes, UA: NEGATIVE
Nitrite: NEGATIVE
Protein, ur: NEGATIVE mg/dL
Specific Gravity, Urine: 1.028 (ref 1.005–1.030)
pH: 5 (ref 5.0–8.0)

## 2017-07-01 LAB — HIV ANTIBODY (ROUTINE TESTING W REFLEX): HIV Screen 4th Generation wRfx: NONREACTIVE

## 2017-07-01 LAB — WET PREP, GENITAL
SPERM: NONE SEEN
Trich, Wet Prep: NONE SEEN
YEAST WET PREP: NONE SEEN

## 2017-07-01 LAB — GC/CHLAMYDIA PROBE AMP (~~LOC~~) NOT AT ARMC
Chlamydia: NEGATIVE
NEISSERIA GONORRHEA: NEGATIVE

## 2017-07-01 LAB — RPR: RPR: NONREACTIVE

## 2017-07-01 MED ORDER — AZITHROMYCIN 250 MG PO TABS
1000.0000 mg | ORAL_TABLET | Freq: Once | ORAL | Status: AC
  Administered 2017-07-01: 1000 mg via ORAL
  Filled 2017-07-01: qty 4

## 2017-07-01 MED ORDER — CEFTRIAXONE SODIUM 250 MG IJ SOLR
250.0000 mg | Freq: Once | INTRAMUSCULAR | Status: AC
  Administered 2017-07-01: 250 mg via INTRAMUSCULAR
  Filled 2017-07-01: qty 250

## 2017-07-01 MED ORDER — METRONIDAZOLE 500 MG PO TABS
500.0000 mg | ORAL_TABLET | Freq: Two times a day (BID) | ORAL | 0 refills | Status: DC
Start: 2017-07-01 — End: 2017-08-31

## 2017-07-01 MED ORDER — METRONIDAZOLE 500 MG PO TABS
500.0000 mg | ORAL_TABLET | Freq: Two times a day (BID) | ORAL | 0 refills | Status: DC
Start: 2017-07-01 — End: 2017-07-01

## 2017-07-01 NOTE — ED Provider Notes (Signed)
[facility] Provider Note   CSN: [id number] Arrival date & time: 06/30/17  2331     History   Chief Complaint Chief Complaint  Patient presents with  . Vaginal Discharge    HPI Chloe Curtis is a 26 y.o. female.  The history is provided by the patient and medical records.  Vaginal Discharge      26 y.o. F with hx of STD's, asthma, presenting to the ED for vaginal discharge.  States this has been an ongoing issues for about 3 weeks now.  She was seen here, diagnosed with BV but then got her cycle so not sure if it ever went away.  She reports thick, white vaginal discharge with odor.  No dysuria or hematuria.  No fever/chills.  No pelvic pain.  Not currently on birth control.  Has not seen GYN in 2+ years due to insurance reasons.  Past Medical History:  Diagnosis Date  . Abscess and cellulitis    s/p I&D by Dr. Shea Evans PCP [facility])  . Asthma   . Chlamydia infection    x2.  . Teenage mother 03/2010   Preterm labor and delivery @ 35 weeks.    Patient Active Problem List   Diagnosis Date Noted  . Vaginal discharge 01/24/2015  . Healthcare maintenance 08/01/2013  . Contraception management 08/01/2013  . Suprapubic pain 03/02/2013  . External hemorrhoid 10/05/2012  . Menorrhagia 05/26/2012  . Well adolescent visit 02/12/2011  . ASTHMA, EXERCISE INDUCED 11/26/2006    History reviewed. No pertinent surgical history.  OB History    No data available       Home Medications    Prior to Admission medications   Medication Sig Start Date End Date Taking? Authorizing Provider  albuterol (PROVENTIL HFA;VENTOLIN HFA) 108 (90 BASE) MCG/ACT inhaler Inhale 2 puffs into the lungs every 4 (four) hours as needed for wheezing or shortness of breath. 03/31/14   Tyrone Nine, MD  albuterol (PROVENTIL) (2.5 MG/3ML) 0.083% nebulizer solution Take 6 mLs (5 mg total) by nebulization every 6 (six) hours as needed for wheezing. Patient not taking: Reported on  04/15/2017 03/27/13   Elpidio Anis, PA-C  amoxicillin (AMOXIL) 500 MG capsule Take 1 capsule (500 mg total) by mouth 3 (three) times daily. 04/15/17   Kirichenko, Tatyana, PA-C  metroNIDAZOLE (FLAGYL) 500 MG tablet Take 1 tablet (500 mg total) by mouth 2 (two) times daily. 04/26/17   Antony Madura, PA-C  oseltamivir (TAMIFLU) 75 MG capsule Take 1 capsule (75 mg total) by mouth every 12 (twelve) hours. Patient not taking: Reported on 04/15/2017 10/18/16   Muthersbaugh, Dahlia Client, PA-C  OVER THE COUNTER MEDICATION Place 2 drops in ear(s) daily as needed (pain).    [provider]  Spacer/Aero-Holding Chambers (AEROCHAMBER PLUS WITH MASK) inhaler Use as instructed Patient not taking: Reported on 04/15/2017 03/31/14   Tyrone Nine, MD    Family History No family history on file.  Social History Social History  Substance Use Topics  . Smoking status: Never Smoker  . Smokeless tobacco: Never Used  . Alcohol use No     Allergies   Advair diskus [fluticasone-salmeterol]   Review of Systems Review of Systems  Genitourinary: Positive for vaginal discharge.  All other systems reviewed and are negative.    Physical Exam Updated Vital Signs BP 95/77 (BP Location: Right Arm)   Pulse 87   Temp 98.5 F (36.9 C) (Oral)   Resp 18   Ht  (1.549 m)  Wt 70.3 kg (155 lb)   SpO2 96%   BMI 29.29 kg/m   Physical Exam  Constitutional: She is oriented to person, place, and time. She appears well-developed and well-nourished.  HENT:  Head: Normocephalic and atraumatic.  Mouth/Throat: Oropharynx is clear and moist.  Eyes: Pupils are equal, round, and reactive to light. Conjunctivae and EOM are normal.  Neck: Normal range of motion.  Cardiovascular: Normal rate, regular rhythm and normal heart sounds.   Pulmonary/Chest: Effort normal and breath sounds normal.  Abdominal: Soft. Bowel sounds are normal. There is no tenderness. There is no rebound.  Genitourinary:  Genitourinary Comments:  Exam chaperoned by RN Normal female external genitalia without visible lesions or rash, moderate amount of thin, white vaginal discharge with odor noted; no bleeding or cervical friability; no adnexal or CMT  Musculoskeletal: Normal range of motion.  Neurological: She is alert and oriented to person, place, and time.  Skin: Skin is warm and dry.  Psychiatric: She has a normal mood and affect.  Nursing note and vitals reviewed.    ED Treatments / Results  Labs (all labs ordered are listed, but only abnormal results are displayed) Labs Reviewed  URINALYSIS, ROUTINE W REFLEX MICROSCOPIC - Abnormal; Notable for the following:       Result Value   APPearance HAZY (*)    All other components within normal limits  WET PREP, GENITAL  HIV ANTIBODY (ROUTINE TESTING)  RPR  PREGNANCY, URINE  GC/CHLAMYDIA PROBE AMP ([place]) NOT AT [facility]    EKG  EKG Interpretation None       Radiology No results found.  Procedures Procedures (including critical care time)  Medications Ordered in ED Medications - No data to display   Initial Impression / Assessment and Plan / ED Course  I have reviewed the triage vital signs and the nursing notes.  Pertinent labs & imaging results that were available during my care of the patient were reviewed by me and considered in my medical decision making (see chart for details).  26 year old female here with vaginal discharge. Seen for same recently. She is afebrile and nontoxic. Denies any urinary symptoms. No abdominal or pelvic pain. UA without signs of infection. Pregnancy test is negative. Pelvic exam performed, moderate amount of thin, white vaginal discharge noted. There is a odor. No adnexal or cervical motion tenderness. Wet prep with clue cells. Patient did recently test positive for chlamydia and expresses concern again for possible STD. Will treat empirically here with Rocephin and azithromycin. Will discharge home on Flagyl. Recommended that she  follow-up closely with the women's clinic given her recurrent issues.  Discussed plan with patient, she acknowledged understanding and agreed with plan of care.  Return precautions given for new or worsening symptoms.  Final Clinical Impressions(s) / ED Diagnoses   Final diagnoses:  BV (bacterial vaginosis)  Vaginal discharge    New Prescriptions New Prescriptions   METRONIDAZOLE (FLAGYL) 500 MG TABLET    Take 1 tablet (500 mg total) by mouth 2 (two) times daily.     Garlon Hatchet, PA-C 07/01/17 1610    Shon Baton, MD 07/02/17 [phone]

## 2017-07-01 NOTE — Discharge Instructions (Addendum)
Take the prescribed medication as directed. Follow-up with the women's clinic-- can call to make appt or you can try to be seen at the walk in area. Return to the ED for new or worsening symptoms.

## 2017-08-30 ENCOUNTER — Encounter (HOSPITAL_COMMUNITY): Payer: Self-pay | Admitting: Emergency Medicine

## 2017-08-30 ENCOUNTER — Other Ambulatory Visit: Payer: Self-pay

## 2017-08-30 DIAGNOSIS — B9689 Other specified bacterial agents as the cause of diseases classified elsewhere: Secondary | ICD-10-CM | POA: Insufficient documentation

## 2017-08-30 DIAGNOSIS — N760 Acute vaginitis: Secondary | ICD-10-CM | POA: Insufficient documentation

## 2017-08-30 DIAGNOSIS — J45909 Unspecified asthma, uncomplicated: Secondary | ICD-10-CM | POA: Insufficient documentation

## 2017-08-30 NOTE — ED Triage Notes (Signed)
Patient is complaining of lower right and left quadrant pain. Patient states she is having some white discharge with a slight odor.

## 2017-08-30 NOTE — ED Notes (Signed)
Pt c/o suprapubic cramping pain onset a week ago that has not went away.

## 2017-08-31 ENCOUNTER — Emergency Department (HOSPITAL_COMMUNITY)
Admission: EM | Admit: 2017-08-31 | Discharge: 2017-08-31 | Disposition: A | Discharge status: Home / Self Care | Payer: Self-pay | Attending: Emergency Medicine | Admitting: Emergency Medicine

## 2017-08-31 DIAGNOSIS — N760 Acute vaginitis: Secondary | ICD-10-CM

## 2017-08-31 DIAGNOSIS — B9689 Other specified bacterial agents as the cause of diseases classified elsewhere: Secondary | ICD-10-CM

## 2017-08-31 LAB — CBC
HCT: 40 % (ref 36.0–46.0)
Hemoglobin: 13.4 g/dL (ref 12.0–15.0)
MCH: 30.7 pg (ref 26.0–34.0)
MCHC: 33.5 g/dL (ref 30.0–36.0)
MCV: 91.5 fL (ref 78.0–100.0)
Platelets: 256 10*3/uL (ref 150–400)
RBC: 4.37 MIL/uL (ref 3.87–5.11)
RDW: 12.4 % (ref 11.5–15.5)
WBC: 14.4 10*3/uL — ABNORMAL HIGH (ref 4.0–10.5)

## 2017-08-31 LAB — COMPREHENSIVE METABOLIC PANEL
ALT: 12 U/L — AB (ref 14–54)
AST: 14 U/L — AB (ref 15–41)
Albumin: 3.7 g/dL (ref 3.5–5.0)
Alkaline Phosphatase: 40 U/L (ref 38–126)
Anion gap: 7 (ref 5–15)
BILIRUBIN TOTAL: 0.5 mg/dL (ref 0.3–1.2)
BUN: 15 mg/dL (ref 6–20)
CALCIUM: 8.8 mg/dL — AB (ref 8.9–10.3)
CO2: 26 mmol/L (ref 22–32)
CREATININE: 0.86 mg/dL (ref 0.44–1.00)
Chloride: 105 mmol/L (ref 101–111)
GFR calc Af Amer: >60 mL/min (ref >60)
GFR calc non Af Amer: >60 mL/min (ref >60)
GLUCOSE: 98 mg/dL (ref 65–99)
Potassium: 4.1 mmol/L (ref 3.5–5.1)
Sodium: 138 mmol/L (ref 135–145)
TOTAL PROTEIN: 6.6 g/dL (ref 6.5–8.1)

## 2017-08-31 LAB — URINALYSIS, ROUTINE W REFLEX MICROSCOPIC
BILIRUBIN URINE: NEGATIVE
Glucose, UA: NEGATIVE mg/dL
KETONES UR: NEGATIVE mg/dL
Leukocytes, UA: NEGATIVE
NITRITE: NEGATIVE
Protein, ur: NEGATIVE mg/dL
Specific Gravity, Urine: >1.03 — ABNORMAL HIGH (ref 1.005–1.030)
pH: 6 (ref 5.0–8.0)

## 2017-08-31 LAB — WET PREP, GENITAL
CLUE CELLS WET PREP: POSITIVE — AB
SPERM: NONE SEEN
Trich, Wet Prep: NONE SEEN
YEAST WET PREP: NONE SEEN

## 2017-08-31 LAB — I-STAT BETA HCG BLOOD, ED (MC, WL, AP ONLY)

## 2017-08-31 LAB — HIV ANTIBODY (ROUTINE TESTING W REFLEX): HIV SCREEN 4TH GENERATION: NONREACTIVE

## 2017-08-31 LAB — URINALYSIS, MICROSCOPIC (REFLEX)

## 2017-08-31 LAB — GC/CHLAMYDIA PROBE AMP (~~LOC~~) NOT AT ARMC
Chlamydia: NEGATIVE
Neisseria Gonorrhea: NEGATIVE

## 2017-08-31 LAB — LIPASE, BLOOD: LIPASE: 24 U/L (ref 11–51)

## 2017-08-31 MED ORDER — METRONIDAZOLE 500 MG PO TABS: 500.0000 mg | ORAL_TABLET | Freq: Two times a day (BID) | ORAL | 0 refills | Status: DC

## 2017-08-31 NOTE — ED Provider Notes (Signed)
[facility]-EMERGENCY DEPT Provider Note   CSN: [id number] Arrival date & time: 08/30/17  2315     History   Chief Complaint Chief Complaint  Patient presents with  . Abdominal Pain    HPI Chloe Curtis is a 26 y.o. female.  HPI   26 year old female with past medical history as below including history of chlamydia here with lower abdominal pain.  The patient states that for the last several weeks, she had intermittent lower abdominal cramping.  This comes and goes.  She denies any associated dyspareunia.  She does state that over the last week, her significant other has noticed a foul-smelling, white vaginal discharge.  She otherwise states that she has not had any increased pain over the last week.  No fevers or chills.  No nausea or vomiting.  Denies any dysuria, hematuria, or urinary frequency.  No flank pain.  Denies any specific alleviating or aggravating factors.  No recent changes in soaps.  Past Medical History:  Diagnosis Date  . Abscess and cellulitis    s/p I&D by Dr. Shea EvansNeal/previous PCP Alvia Grove(Bonnie Burnham)  . Asthma   . Chlamydia infection    x2.  . Teenage mother 03/2010   Preterm labor and delivery @ 35 weeks.    Patient Active Problem List   Diagnosis Date Noted  . Vaginal discharge 01/24/2015  . Healthcare maintenance 08/01/2013  . Contraception management 08/01/2013  . Suprapubic pain 03/02/2013  . External hemorrhoid 10/05/2012  . Menorrhagia 05/26/2012  . Well adolescent visit 02/12/2011  . ASTHMA, EXERCISE INDUCED 11/26/2006    History reviewed. No pertinent surgical history.  OB History    No data available       Home Medications    Prior to Admission medications   Medication Sig Start Date End Date Taking? Authorizing Provider  albuterol (PROVENTIL HFA;VENTOLIN HFA) 108 (90 BASE) MCG/ACT inhaler Inhale 2 puffs into the lungs every 4 (four) hours as needed for wheezing or shortness of breath. 03/31/14  Yes Tyrone NineGrunz, Ryan B,  MD  albuterol (PROVENTIL) (2.5 MG/3ML) 0.083% nebulizer solution Take 6 mLs (5 mg total) by nebulization every 6 (six) hours as needed for wheezing. 03/27/13  Yes Upstill, Melvenia BeamShari, PA-C  metroNIDAZOLE (FLAGYL) 500 MG tablet Take 1 tablet (500 mg total) by mouth 2 (two) times daily. 08/31/17   Shaune PollackIsaacs, Tarik Teixeira, MD    Family History History reviewed. No pertinent family history.  Social History Social History   Tobacco Use  . Smoking status: Never Smoker  . Smokeless tobacco: Never Used  Substance Use Topics  . Alcohol use: No  . Drug use: No     Allergies   Advair diskus [fluticasone-salmeterol]   Review of Systems Review of Systems  Constitutional: Positive for fatigue.  Genitourinary: Positive for pelvic pain and vaginal discharge.  All other systems reviewed and are negative.    Physical Exam Updated Vital Signs BP 116/72 (BP Location: Left Arm)   Pulse 92   Temp 98.2 F (36.8 C) (Oral)   Resp 16   Ht 5\' 1"  (1.549 m)   Wt 70.3 kg (155 lb)   SpO2 97%   BMI 29.29 kg/m   Physical Exam  Constitutional: She is oriented to person, place, and time. She appears well-developed and well-nourished. No distress.  HENT:  Head: Normocephalic and atraumatic.  Eyes: Conjunctivae are normal.  Neck: Neck supple.  Cardiovascular: Normal rate, regular rhythm and normal heart sounds. Exam reveals no friction rub.  No murmur heard. Pulmonary/Chest:  Effort normal and breath sounds normal. No respiratory distress. She has no wheezes. She has no rales.  Abdominal: Soft. Normal appearance and bowel sounds are normal. She exhibits no distension. There is no rigidity, no rebound and no guarding.  Genitourinary:  Genitourinary Comments: Moderate white vaginal discharge and vaginal vault, with no vaginal erythema.  No cervical motion tenderness.  No adnexal tenderness or fullness.  Musculoskeletal: She exhibits no edema.  Neurological: She is alert and oriented to person, place, and time.  She exhibits normal muscle tone.  Skin: Skin is warm. Capillary refill takes less than 2 seconds.  Psychiatric: She has a normal mood and affect.  Nursing note and vitals reviewed.    ED Treatments / Results  Labs (all labs ordered are listed, but only abnormal results are displayed) Labs Reviewed  WET PREP, GENITAL - Abnormal; Notable for the following components:      Result Value   Clue Cells Wet Prep HPF POC POSITIVE (*)    WBC, Wet Prep HPF POC MODERATE (*)    All other components within normal limits  COMPREHENSIVE METABOLIC PANEL - Abnormal; Notable for the following components:   Calcium 8.8 (*)    AST 14 (*)    ALT 12 (*)    All other components within normal limits  CBC - Abnormal; Notable for the following components:   WBC 14.4 (*)    All other components within normal limits  URINALYSIS, ROUTINE W REFLEX MICROSCOPIC - Abnormal; Notable for the following components:   APPearance HAZY (*)    Specific Gravity, Urine >1.030 (*)    Hgb urine dipstick TRACE (*)    All other components within normal limits  URINALYSIS, MICROSCOPIC (REFLEX) - Abnormal; Notable for the following components:   Bacteria, UA RARE (*)    Squamous Epithelial / LPF 6-30 (*)    All other components within normal limits  LIPASE, BLOOD  HIV ANTIBODY (ROUTINE TESTING)  I-STAT BETA HCG BLOOD, ED ([facility], [facility], [facility] ONLY)  GC/CHLAMYDIA PROBE AMP ([place]) NOT AT [facility]    EKG  EKG Interpretation None       Radiology No results found.  Procedures Procedures (including critical care time)  Medications Ordered in ED Medications - No data to display   Initial Impression / Assessment and Plan / ED Course  I have reviewed the triage vital signs and the nursing notes.  Pertinent labs & imaging results that were available during my care of the patient were reviewed by me and considered in my medical decision making (see chart for details).     26 year old female here with intermittent, mild  pelvic pain and vaginal discharge.  No dyspareunia.  No fevers or chills.  On exam, the patient has white discharge consistent with bacterial vaginosis, which is consistent with her wet prep.  She has no cervical motion or adnexal tenderness to suggest PID.  She has no evidence to suggest UTI.  Her abdomen is otherwise completely soft.  No right lower quadrant tenderness or signs of appendicitis.  Will treat her with Flagyl for BV.  Final Clinical Impressions(s) / ED Diagnoses   Final diagnoses:  BV (bacterial vaginosis)    ED Discharge Orders        Ordered    metroNIDAZOLE (FLAGYL) 500 MG tablet  2 times daily     08/31/17 0228       Shaune PollackIsaacs, Rylie [place], MD 08/31/17 [phone]

## 2017-12-19 DIAGNOSIS — H10023 Other mucopurulent conjunctivitis, bilateral: Secondary | ICD-10-CM | POA: Diagnosis not present

## 2018-04-25 ENCOUNTER — Emergency Department (HOSPITAL_COMMUNITY)
Admission: EM | Admit: 2018-04-25 | Discharge: 2018-04-25 | Disposition: A | Discharge status: Home / Self Care | Payer: Medicaid Other | Attending: Emergency Medicine | Admitting: Emergency Medicine

## 2018-04-25 ENCOUNTER — Encounter (HOSPITAL_COMMUNITY): Payer: Self-pay

## 2018-04-25 DIAGNOSIS — Z79899 Other long term (current) drug therapy: Secondary | ICD-10-CM | POA: Diagnosis not present

## 2018-04-25 DIAGNOSIS — N939 Abnormal uterine and vaginal bleeding, unspecified: Secondary | ICD-10-CM | POA: Diagnosis present

## 2018-04-25 DIAGNOSIS — N921 Excessive and frequent menstruation with irregular cycle: Secondary | ICD-10-CM | POA: Diagnosis not present

## 2018-04-25 LAB — WET PREP, GENITAL
Sperm: NONE SEEN
Trich, Wet Prep: NONE SEEN
YEAST WET PREP: NONE SEEN

## 2018-04-25 LAB — I-STAT BETA HCG BLOOD, ED (MC, WL, AP ONLY): I-stat hCG, quantitative: <5 m[IU]/mL (ref <5)

## 2018-04-25 LAB — I-STAT TROPONIN, ED: Troponin i, poc: 0 ng/mL (ref 0.00–0.08)

## 2018-04-25 NOTE — ED Notes (Signed)
Patient verbalizes understanding of medications and discharge instructions. No further questions at this time. VSS and patient ambulatory at discharge.   

## 2018-04-25 NOTE — ED Notes (Signed)
ED Provider at bedside. 

## 2018-04-25 NOTE — Discharge Instructions (Signed)
Take Tylenol or ibuprofen for any cramping.  Return if bleeding worsens, especially if you experience one fully soaked pad every hour for at least 4 consecutive hours.

## 2018-04-25 NOTE — ED Notes (Signed)
[facility] lab notified of troponin that was completed and no order was put in, troponin to be credited to chart

## 2018-04-25 NOTE — ED Provider Notes (Signed)
[facility] EMERGENCY DEPARTMENT Provider Note   CSN: [id number] Arrival date & time: 04/25/18  2130     History   Chief Complaint Chief Complaint  Patient presents with  . Vaginal Bleeding    HPI Chloe Curtis is a 27 y.o. female.  27 year old female presents to the emergency department for vaginal bleeding.  This began while she was at work.  She reports a more heavy passage of blood from her vaginal canal which began while using the bathroom to void.  She has continued to experience spotting requiring the use of a light pad.  She has had mild abdominal cramping.  This began yesterday.  She denies taking any medications for her symptoms.  Denies any traumatic vaginal intercourse or use of foreign bodies.  No fevers, dysuria, nausea, vomiting.  Denies the use of birth control.  Last sexually active without the use of protection 2 days ago.  The history is provided by the patient. No language interpreter was used.  Vaginal Bleeding  Primary symptoms include vaginal bleeding.    Past Medical History:  Diagnosis Date  . Abscess and cellulitis    s/p I&D by Dr. Shea Evans PCP [facility])  . Asthma   . Chlamydia infection    x2.  . Teenage mother 03/2010   Preterm labor and delivery @ 35 weeks.    Patient Active Problem List   Diagnosis Date Noted  . Vaginal discharge 01/24/2015  . Healthcare maintenance 08/01/2013  . Contraception management 08/01/2013  . Suprapubic pain 03/02/2013  . External hemorrhoid 10/05/2012  . Menorrhagia 05/26/2012  . Well adolescent visit 02/12/2011  . ASTHMA, EXERCISE INDUCED 11/26/2006    History reviewed. No pertinent surgical history.   OB History   None      Home Medications    Prior to Admission medications   Medication Sig Start Date End Date Taking? Authorizing Provider  albuterol (PROVENTIL HFA;VENTOLIN HFA) 108 (90 BASE) MCG/ACT inhaler Inhale 2 puffs into the lungs every 4 (four) hours as  needed for wheezing or shortness of breath. 03/31/14   Tyrone Nine, MD  albuterol (PROVENTIL) (2.5 MG/3ML) 0.083% nebulizer solution Take 6 mLs (5 mg total) by nebulization every 6 (six) hours as needed for wheezing. 03/27/13   Elpidio Anis, PA-C  metroNIDAZOLE (FLAGYL) 500 MG tablet Take 1 tablet (500 mg total) by mouth 2 (two) times daily. 08/31/17   Shaune Pollack, MD    Family History No family history on file.  Social History Social History   Tobacco Use  . Smoking status: Never Smoker  . Smokeless tobacco: Never Used  Substance Use Topics  . Alcohol use: No  . Drug use: No     Allergies   Advair diskus [fluticasone-salmeterol]   Review of Systems Review of Systems  Genitourinary: Positive for vaginal bleeding.  Ten systems reviewed and are negative for acute change, except as noted in the HPI.    Physical Exam Updated Vital Signs BP 123/76 (BP Location: Left Arm)   Pulse 91   Temp 98.8 F (37.1 C) (Oral)   Resp 16   SpO2 99%   Physical Exam  Constitutional: She is oriented to person, place, and time. She appears well-developed and well-nourished. No distress.  Nontoxic appearing and in NAD  HENT:  Head: Normocephalic and atraumatic.  Eyes: Conjunctivae and EOM are normal. No scleral icterus.  Neck: Normal range of motion.  Cardiovascular: Normal rate, regular rhythm and intact distal pulses.  Pulmonary/Chest: Effort normal.  No stridor. No respiratory distress. She has no wheezes.  Respirations even and unlabored  Abdominal: Soft. She exhibits no distension and no mass. There is no tenderness. There is no guarding.  Genitourinary:  Genitourinary Comments: Blood in vaginal vault. No clots. No evidence of vaginal trauma. No CMT or adnexal TTP.  Musculoskeletal: Normal range of motion.  Neurological: She is alert and oriented to person, place, and time. She exhibits normal muscle tone. Coordination normal.  Skin: Skin is warm and dry. No rash noted. She is not  diaphoretic. No erythema. No pallor.  Psychiatric: She has a normal mood and affect. Her behavior is normal.  Nursing note and vitals reviewed.    ED Treatments / Results  Labs (all labs ordered are listed, but only abnormal results are displayed) Labs Reviewed  WET PREP, GENITAL - Abnormal; Notable for the following components:      Result Value   Clue Cells Wet Prep HPF POC PRESENT (*)    WBC, Wet Prep HPF POC FEW (*)    All other components within normal limits  I-STAT BETA HCG BLOOD, ED ([facility], [facility], [facility] ONLY)  I-STAT TROPONIN, ED  GC/CHLAMYDIA PROBE AMP ([place]) NOT AT [facility]    EKG None  Radiology No results found.  Procedures Procedures (including critical care time)  Medications Ordered in ED Medications - No data to display   Initial Impression / Assessment and Plan / ED Course  I have reviewed the triage vital signs and the nursing notes.  Pertinent labs & imaging results that were available during my care of the patient were reviewed by me and considered in my medical decision making (see chart for details).     27 year old female presents to the emergency department for vaginal bleeding.  Her clinical picture is consistent with metrorrhagia.  She has no significant abdominal pain.  Abdomen is soft and nontender.  Vital signs stable without tachycardia or hypotension.  She has a small amount of blood in the vaginal vault.  No present concern for emergent blood loss.  Pregnancy test is negative and wet prep reassuring, without concern for STDs.  Will continue with outpatient OB/GYN follow-up.  Bleeding precautions discussed. Patient discharged in stable condition with no unaddressed concerns.   Final Clinical Impressions(s) / ED Diagnoses   Final diagnoses:  Metrorrhagia    ED Discharge Orders    None       Antony MaduraHumes, Caitlen Worth, PA-C 04/25/18 2341    Mesner, Barbara CowerJason, MD 04/25/18 2342

## 2018-04-25 NOTE — ED Triage Notes (Signed)
Pt states that she recently had her period on the 13 th, today was at work and had a large amount of vaginal bleeding, has not been on birth control in four years. Denies clots or discharge.

## 2018-04-26 LAB — GC/CHLAMYDIA PROBE AMP (~~LOC~~) NOT AT ARMC
Chlamydia: NEGATIVE
NEISSERIA GONORRHEA: NEGATIVE

## 2018-05-06 ENCOUNTER — Encounter: Payer: Self-pay | Admitting: Family Medicine

## 2018-05-06 ENCOUNTER — Ambulatory Visit (INDEPENDENT_AMBULATORY_CARE_PROVIDER_SITE_OTHER): Payer: Medicaid Other | Admitting: Family Medicine

## 2018-05-06 VITALS — BP 126/72 | HR 84 | Ht 61.0 in | Wt 148.8 lb

## 2018-05-06 DIAGNOSIS — N760 Acute vaginitis: Secondary | ICD-10-CM

## 2018-05-06 DIAGNOSIS — B9689 Other specified bacterial agents as the cause of diseases classified elsewhere: Secondary | ICD-10-CM

## 2018-05-06 DIAGNOSIS — N939 Abnormal uterine and vaginal bleeding, unspecified: Secondary | ICD-10-CM

## 2018-05-06 MED ORDER — METRONIDAZOLE 500 MG PO TABS: 500.0000 mg | ORAL_TABLET | Freq: Two times a day (BID) | ORAL | 0 refills | Status: DC

## 2018-05-06 NOTE — Progress Notes (Signed)
States periods usually regular but suddenly had a heavy period that came on all of a sudden. Had just had a period not long before.

## 2018-05-06 NOTE — Progress Notes (Signed)
   Subjective:    Patient ID: Chloe Curtis, female    DOB: 1991-07-16, 27 y.o.   MRN: [id number]  HPI Patient seen for ED follow up for AUB. For past year, has been having a regular period every 28 days. Was seen on 7/28 for AUB that started 7 days after finishing her last period. Had heavy bleeding for 2 days, then it stopped again. No bleeding since then. No pain. Does have some heavy discharge. Wet prep at ED showed BV - was not treated.  I have reviewed the patients past medical, family, and social history.  I have reviewed the patient's medication list and allergies.   Review of Systems     Objective:   Physical Exam  Constitutional: She is oriented to person, place, and time. She appears well-developed and well-nourished.  Cardiovascular: Normal rate.  Pulmonary/Chest: Effort normal.  Abdominal: Hernia confirmed negative in the right inguinal area and confirmed negative in the left inguinal area.  Genitourinary: Vagina normal. There is no rash, tenderness, lesion or injury on the right labia. There is no rash, tenderness or injury on the left labia. Uterus is not deviated, not enlarged, not fixed and not tender. Cervix exhibits no motion tenderness, no discharge and no friability. Right adnexum displays no mass, no tenderness and no fullness. Left adnexum displays no mass, no tenderness and no fullness. No erythema, tenderness or bleeding in the vagina. No foreign body in the vagina. No signs of injury around the vagina. No vaginal discharge found.  Lymphadenopathy: No inguinal adenopathy noted on the right or left side.  Neurological: She is alert and oriented to person, place, and time.       Assessment & Plan:  1. Abnormal uterine bleeding (AUB) Isolated episode. Will watch for now to see if pattern emerges or if stays isolated episode. Follow up in 6-8 weeks for annual exam  2. BV (bacterial vaginosis) Flagyl 500mg  BID x7 days.

## 2018-08-05 DIAGNOSIS — H16223 Keratoconjunctivitis sicca, not specified as Sjogren's, bilateral: Secondary | ICD-10-CM | POA: Diagnosis not present

## 2018-10-28 ENCOUNTER — Telehealth: Payer: Self-pay | Admitting: *Deleted

## 2018-10-28 NOTE — Telephone Encounter (Signed)
[place] left a message she is trying to see if you can a refill of her flagyl that she never picked up .

## 2018-11-02 ENCOUNTER — Other Ambulatory Visit: Payer: Self-pay

## 2018-11-02 MED ORDER — METRONIDAZOLE 500 MG PO TABS: 500.0000 mg | ORAL_TABLET | Freq: Two times a day (BID) | ORAL | 0 refills | Status: DC

## 2018-11-02 NOTE — Telephone Encounter (Signed)
Patient called requesting a rx on flagyl. She is having symptoms of vaginal discharge and fishy odor. Advised patient we can called her one dose of flagyl in but going forward if symptoms do not get better she will need to be seen in our office. Patient voice understanding at this time.

## 2018-11-02 NOTE — Telephone Encounter (Signed)
Patient reports having same symptoms she had before when she had BV. She is requesting a refill on Flagyl. I have advised patient we will call in one more refill. If this continues she will need to come in to be seen.

## 2019-03-22 ENCOUNTER — Telehealth: Payer: Self-pay

## 2019-03-22 NOTE — Telephone Encounter (Signed)
The patient called in to schedule an upcoming appointment. Informed the patient of the restrictions due to [place]. Informed the patient of the wearing a mask, no visitors or children are allowed in the office during the visit. Informed the patient of the of sanitizing hands at the sanitizing station. Also sending an appointment reminder.

## 2019-04-07 ENCOUNTER — Telehealth: Payer: Self-pay | Admitting: Obstetrics & Gynecology

## 2019-04-07 NOTE — Telephone Encounter (Signed)
Called the patient to inform of upcoming appointment. Left a detailed voicemail of the location, appointment time and date. Also informed the patient if they’ve been in close contact with someone or diagnosed with Covid19 in the previous 14-day period please call to reschedule. If the patient has also experience flu-like symptoms such as sore throat, fever, and/or shortness of breath. Please call our office to reschedule. In addition, please be sure to wear a face mask to the visit and sanitize hands upon entering the office. Also, no children or visitors or allowed due to Covid19 restrictions. If you have any questions or concerns, please contact our office. °

## 2019-04-08 ENCOUNTER — Encounter: Payer: Self-pay | Admitting: Family Medicine

## 2019-04-08 ENCOUNTER — Other Ambulatory Visit: Payer: Self-pay

## 2019-04-08 ENCOUNTER — Other Ambulatory Visit (HOSPITAL_COMMUNITY)
Admission: RE | Admit: 2019-04-08 | Discharge: 2019-04-08 | Disposition: A | Discharge status: Home / Self Care | Payer: Medicaid Other | Source: Ambulatory Visit | Attending: Family Medicine | Admitting: Family Medicine

## 2019-04-08 ENCOUNTER — Ambulatory Visit (INDEPENDENT_AMBULATORY_CARE_PROVIDER_SITE_OTHER): Payer: Medicaid Other | Admitting: Family Medicine

## 2019-04-08 VITALS — BP 118/76 | HR 83 | Wt 147.6 lb

## 2019-04-08 DIAGNOSIS — N760 Acute vaginitis: Secondary | ICD-10-CM

## 2019-04-08 DIAGNOSIS — B9689 Other specified bacterial agents as the cause of diseases classified elsewhere: Secondary | ICD-10-CM

## 2019-04-08 DIAGNOSIS — Z01419 Encounter for gynecological examination (general) (routine) without abnormal findings: Secondary | ICD-10-CM | POA: Insufficient documentation

## 2019-04-08 DIAGNOSIS — Z0000 Encounter for general adult medical examination without abnormal findings: Secondary | ICD-10-CM

## 2019-04-08 DIAGNOSIS — N979 Female infertility, unspecified: Secondary | ICD-10-CM

## 2019-04-08 NOTE — Progress Notes (Signed)
GYNECOLOGY ANNUAL PREVENTATIVE CARE ENCOUNTER NOTE  Subjective:   Chloe Curtis is a 28 y.o. [id number] female here for a routine annual gynecologic exam.  Current complaints: ? Recurrent BV symptoms. Has been trying to get pregnant, but has been unsuccessful.   Denies abnormal vaginal bleeding, discharge, pelvic pain, problems with intercourse or other gynecologic concerns.    Gynecologic History No LMP recorded. (Menstrual status: Irregular Periods). Patient is sexually active  Contraception: none Last Pap: 2017. Results were: normal  Obstetric History OB History  Gravida Para Term Preterm AB Living  1 1   1   1   SAB TAB Ectopic Multiple Live Births          1    # Outcome Date GA Lbr Len/2nd Weight Sex Delivery Anes PTL Lv  1 Preterm 2011 [id number]       LIV    Past Medical History:  Diagnosis Date  . Abscess and cellulitis    s/p I&D by Dr. Shea EvansNeal/previous PCP [facility])  . Asthma   . Chlamydia infection    x2.  . Teenage mother 03/2010   Preterm labor and delivery @ 35 weeks.    No past surgical history on file.  Current Outpatient Medications on File Prior to Visit  Medication Sig Dispense Refill  . albuterol (PROVENTIL HFA;VENTOLIN HFA) 108 (90 BASE) MCG/ACT inhaler Inhale 2 puffs into the lungs every 4 (four) hours as needed for wheezing or shortness of breath. 6.7 g 1  . albuterol (PROVENTIL) (2.5 MG/3ML) 0.083% nebulizer solution Take 6 mLs (5 mg total) by nebulization every 6 (six) hours as needed for wheezing. 75 mL 12  . metroNIDAZOLE (FLAGYL) 500 MG tablet Take 1 tablet (500 mg total) by mouth 2 (two) times daily. (Patient not taking: Reported on 04/08/2019) 14 tablet 0   No current facility-administered medications on file prior to visit.     Allergies  Allergen Reactions  . Advair Diskus [Fluticasone-Salmeterol] Swelling    Social History   Socioeconomic History  . Marital status: Single    Spouse name: Not on file  . Number of children: Not  on file  . Years of education: Not on file  . Highest education level: Not on file  Occupational History  . Not on file  Social Needs  . Financial resource strain: Not on file  . Food insecurity    Worry: Not on file    Inability: Not on file  . Transportation needs    Medical: Not on file    Non-medical: Not on file  Tobacco Use  . Smoking status: Never Smoker  . Smokeless tobacco: Never Used  Substance and Sexual Activity  . Alcohol use: No  . Drug use: No  . Sexual activity: Yes    Birth control/protection: None  Lifestyle  . Physical activity    Days per week: Not on file    Minutes per session: Not on file  . Stress: Not on file  Relationships  . Social Musicianconnections    Talks on phone: Not on file    Gets together: Not on file    Attends religious service: Not on file    Active member of club or organization: Not on file    Attends meetings of clubs or organizations: Not on file    Relationship status: Not on file  . Intimate partner violence    Fear of current or ex partner: Not on file    Emotionally abused: Not on file  Physically abused: Not on file    Forced sexual activity: Not on file  Other Topics Concern  . Not on file  Social History Narrative   Lives with grandmother, sister, aunt/uncle/cousin, and daughter (who is also patient of mine). Grandmother has custody over patient and sister. Parents with h/o drug abuse.    FOB is patient's boyfriend and involved in baby's and patient's life.    Education: currently high school student @ [employer]; finished junior year June 2012;wants to study counseling/psychology          Family History  Problem Relation Age of Onset  . Leukemia Paternal Grandmother   . Drug abuse Father   . Asthma Mother   . Drug abuse Mother     The following portions of the patient's history were reviewed and updated as appropriate: allergies, current medications, past family history, past medical history, past social history, past  surgical history and problem list.  Review of Systems Pertinent items are noted in HPI.   Objective:  BP 118/76   Pulse 83   Wt 147 lb 9.6 oz (67 kg)   BMI 27.89 kg/m  Wt Readings from Last 3 Encounters:  04/08/19 147 lb 9.6 oz (67 kg)  05/06/18 148 lb 12.8 oz (67.5 kg)  08/30/17 155 lb (70.3 kg)     CONSTITUTIONAL: Well-developed, well-nourished female in no acute distress.  HENT:  Normocephalic, atraumatic, External right and left ear normal. Oropharynx is clear and moist EYES: Conjunctivae and EOM are normal. Pupils are equal, round, and reactive to light. No scleral icterus.  NECK: Normal range of motion, supple, no masses.  Normal thyroid.   CARDIOVASCULAR: Normal heart rate noted, regular rhythm RESPIRATORY: Clear to auscultation bilaterally. Effort and breath sounds normal, no problems with respiration noted. ABDOMEN: Soft, normal bowel sounds, no distention noted.  No tenderness, rebound or guarding.  PELVIC: Normal appearing external genitalia; normal appearing vaginal mucosa and cervix.  No abnormal discharge noted.  Normal uterine size, no other palpable masses, no uterine or adnexal tenderness. MUSCULOSKELETAL: Normal range of motion. No tenderness.  No cyanosis, clubbing, or edema.  2+ distal pulses. SKIN: Skin is warm and dry. No rash noted. Not diaphoretic. No erythema. No pallor. NEUROLOGIC: Alert and oriented to person, place, and time. Normal reflexes, muscle tone coordination. No cranial nerve deficit noted. PSYCHIATRIC: Normal mood and affect. Normal behavior. Normal judgment and thought content.  Assessment:  Annual gynecologic examination with pap smear   Plan:  1. Well Woman Exam Will follow up results of pap smear and manage accordingly. - Cytology - PAP( [place]) - Cervicovaginal ancillary only( [place])  2. Infertility Refer to Dr Kerin Perna.  Routine preventative health maintenance measures emphasized. Please refer to After Visit Summary  for other counseling recommendations.    Loma Boston, [place] for [employer]

## 2019-04-11 LAB — CERVICOVAGINAL ANCILLARY ONLY
Bacterial vaginitis: POSITIVE — AB
Candida vaginitis: NEGATIVE
Trichomonas: NEGATIVE

## 2019-04-11 LAB — CYTOLOGY - PAP
Chlamydia: POSITIVE — AB
Diagnosis: NEGATIVE
Neisseria Gonorrhea: POSITIVE — AB

## 2019-04-12 ENCOUNTER — Telehealth (INDEPENDENT_AMBULATORY_CARE_PROVIDER_SITE_OTHER): Payer: Medicaid Other | Admitting: Lactation Services

## 2019-04-12 ENCOUNTER — Telehealth: Payer: Self-pay | Admitting: Family Medicine

## 2019-04-12 DIAGNOSIS — A549 Gonococcal infection, unspecified: Secondary | ICD-10-CM

## 2019-04-12 DIAGNOSIS — A749 Chlamydial infection, unspecified: Secondary | ICD-10-CM

## 2019-04-12 NOTE — Telephone Encounter (Signed)
-----   Message from Donnamae Jude, MD sent at 04/11/2019  5:19 PM EDT ----- Has both GC and Chlam--please call inform and treat and advise treatment for her partner.Marland KitchenMarland Kitchen

## 2019-04-12 NOTE — Telephone Encounter (Signed)
Called pt to inform her that she has Chlamydia and Gonorrhea on her Pap from last week. She reports she did see the results but did not understand them.  Discussed these are Sexually transmitted diseases and both partners needs to be treated. Informed pt that the front office will be calling to schedule for her to come in to be treated and that her partner needs to call the [facility] Department to be treated also. Reviewed that they should refrain from Sexual Intercourse for a month until repeat testing comes back negative. Pt reports she is planning to call the Health Department to find out when she and her partner can be treated and will inform front desk if she is being treated at the Health Department or needs to be treated here. Pt to call back with any questions/concerns as needed.

## 2019-04-12 NOTE — Telephone Encounter (Signed)
Attempted to call patient with her nurse visit appointment on 7/15 @ 10:20. No answer, left voicemail with the appointment information. Patient instructed to wear a face mask for the entire appointment and no visitors are allowed during the visit. Patient instructed if she has any symptoms not to attend the appointment, instead give the office a call back to be rescheduled. Mychart message also sent.

## 2019-04-13 ENCOUNTER — Ambulatory Visit (INDEPENDENT_AMBULATORY_CARE_PROVIDER_SITE_OTHER): Payer: Medicaid Other

## 2019-04-13 ENCOUNTER — Telehealth (HOSPITAL_COMMUNITY): Payer: Self-pay | Admitting: Lactation Services

## 2019-04-13 ENCOUNTER — Other Ambulatory Visit: Payer: Self-pay

## 2019-04-13 DIAGNOSIS — A549 Gonococcal infection, unspecified: Secondary | ICD-10-CM | POA: Diagnosis not present

## 2019-04-13 DIAGNOSIS — A749 Chlamydial infection, unspecified: Secondary | ICD-10-CM

## 2019-04-13 MED ORDER — AZITHROMYCIN 250 MG PO TABS
1000.0000 mg | ORAL_TABLET | Freq: Once | ORAL | Status: AC
  Administered 2019-04-13: 12:00:00 1000 mg via ORAL

## 2019-04-13 MED ORDER — CEFTRIAXONE SODIUM 250 MG IJ SOLR
250.0000 mg | Freq: Once | INTRAMUSCULAR | Status: AC
  Administered 2019-04-13: 12:00:00 250 mg via INTRAMUSCULAR

## 2019-04-13 NOTE — Telephone Encounter (Signed)
Attempted to call pt to let her know she has a nurse visit appointment today at 10:20. Pt did not answer. Advised pt to check her My Chart message for appt notification.

## 2019-04-13 NOTE — Progress Notes (Signed)
Pt here today for STD for + GC/CH.  Pt tolerated oral and injection medication well. Pt advised to not have intercourse until two weeks after her and partner have been treated.  Pt verbalized understanding.    Mel Almond, RN 04/13/19

## 2019-04-14 ENCOUNTER — Encounter: Payer: Self-pay | Admitting: *Deleted

## 2019-05-02 DIAGNOSIS — H43393 Other vitreous opacities, bilateral: Secondary | ICD-10-CM | POA: Diagnosis not present

## 2019-06-01 DIAGNOSIS — G44209 Tension-type headache, unspecified, not intractable: Secondary | ICD-10-CM | POA: Diagnosis not present

## 2019-06-03 DIAGNOSIS — H40033 Anatomical narrow angle, bilateral: Secondary | ICD-10-CM | POA: Diagnosis not present

## 2019-06-03 DIAGNOSIS — H16223 Keratoconjunctivitis sicca, not specified as Sjogren's, bilateral: Secondary | ICD-10-CM | POA: Diagnosis not present

## 2019-06-05 DIAGNOSIS — H5213 Myopia, bilateral: Secondary | ICD-10-CM | POA: Diagnosis not present

## 2019-09-26 ENCOUNTER — Other Ambulatory Visit: Payer: Self-pay

## 2019-09-26 ENCOUNTER — Encounter (HOSPITAL_COMMUNITY): Payer: Self-pay

## 2019-09-26 ENCOUNTER — Ambulatory Visit (HOSPITAL_COMMUNITY)
Admission: EM | Admit: 2019-09-26 | Discharge: 2019-09-26 | Disposition: A | Discharge status: Home / Self Care | Payer: Medicaid Other | Attending: Family Medicine | Admitting: Family Medicine

## 2019-09-26 DIAGNOSIS — R432 Parageusia: Secondary | ICD-10-CM | POA: Diagnosis not present

## 2019-09-26 DIAGNOSIS — U071 COVID-19: Secondary | ICD-10-CM | POA: Insufficient documentation

## 2019-09-26 DIAGNOSIS — R430 Anosmia: Secondary | ICD-10-CM | POA: Diagnosis not present

## 2019-09-26 DIAGNOSIS — Z888 Allergy status to other drugs, medicaments and biological substances status: Secondary | ICD-10-CM | POA: Diagnosis not present

## 2019-09-26 DIAGNOSIS — Z825 Family history of asthma and other chronic lower respiratory diseases: Secondary | ICD-10-CM | POA: Diagnosis not present

## 2019-09-26 NOTE — ED Triage Notes (Addendum)
Pt. States she cant smell/taste this morning, states she couldn't taste her breakfast. Denies ANY other symptoms.

## 2019-09-26 NOTE — Discharge Instructions (Signed)
Your Covid swab is pending.  We should have the results in a couple days. Make sure you go home and take quarantine precautions. You can take over-the-counter medications as needed for your symptoms Follow up as needed for continued or worsening symptoms

## 2019-09-27 LAB — NOVEL CORONAVIRUS, NAA (HOSP ORDER, SEND-OUT TO REF LAB; TAT 18-24 HRS): SARS-CoV-2, NAA: DETECTED — AB

## 2019-09-27 NOTE — ED Provider Notes (Signed)
[facility]    CSN: [id number] Arrival date & time: 09/26/19  4132      History   Chief Complaint Chief Complaint  Patient presents with  . COVID testing    HPI Chloe Curtis is a 28 y.o. female.   Patient is a 28 year old female presents today for loss of taste and smell that started this morning.  Reported she was concerned when she could not taste her breakfast.  Denies any other associated symptoms.  Reporting that she works to [employer] jobs and is a lot of people daily.  No fever, chills, body aches, night sweats, cough, chest congestion.  ROS per HPI      Past Medical History:  Diagnosis Date  . Abscess and cellulitis    s/p I&D by Dr. Shea Evans PCP [facility])  . Asthma   . Chlamydia infection    x2.  . Teenage mother 03/2010   Preterm labor and delivery @ 35 weeks.    Patient Active Problem List   Diagnosis Date Noted  . Vaginal discharge 01/24/2015  . Healthcare maintenance 08/01/2013  . Contraception management 08/01/2013  . Suprapubic pain 03/02/2013  . External hemorrhoid 10/05/2012  . Menorrhagia 05/26/2012  . Well adolescent visit 02/12/2011  . ASTHMA, EXERCISE INDUCED 11/26/2006    History reviewed. No pertinent surgical history.  OB History    Gravida  1   Para  1   Term      Preterm  1   AB      Living  1     SAB      TAB      Ectopic      Multiple      Live Births  1            Home Medications    Prior to Admission medications   Medication Sig Start Date End Date Taking? Authorizing Provider  albuterol (PROVENTIL HFA;VENTOLIN HFA) 108 (90 BASE) MCG/ACT inhaler Inhale 2 puffs into the lungs every 4 (four) hours as needed for wheezing or shortness of breath. 03/31/14   Tyrone Nine, MD  albuterol (PROVENTIL) (2.5 MG/3ML) 0.083% nebulizer solution Take 6 mLs (5 mg total) by nebulization every 6 (six) hours as needed for wheezing. 03/27/13   Elpidio Anis, PA-C  metroNIDAZOLE (FLAGYL)  500 MG tablet Take 1 tablet (500 mg total) by mouth 2 (two) times daily. Patient not taking: Reported on 04/08/2019 11/02/18   Tamera Stands, DO    Family History Family History  Problem Relation Age of Onset  . Leukemia Paternal Grandmother   . Drug abuse Father   . Asthma Mother   . Drug abuse Mother     Social History Social History   Tobacco Use  . Smoking status: Never Smoker  . Smokeless tobacco: Never Used  Substance Use Topics  . Alcohol use: No  . Drug use: No     Allergies   Advair diskus [fluticasone-salmeterol]   Review of Systems Review of Systems   Physical Exam Triage Vital Signs ED Triage Vitals  Enc Vitals Group     BP 09/26/19 1023 128/81     Pulse Rate 09/26/19 1023 (!) 106     Resp 09/26/19 1023 16     Temp 09/26/19 1023 98.7 F (37.1 C)     Temp Source 09/26/19 1023 Oral     SpO2 09/26/19 1023 95 %     Weight 09/26/19 1020 157 lb 12.8 oz (71.6 kg)  Height --      Head Circumference --      Peak Flow --      Pain Score 09/26/19 1020 0     Pain Loc --      Pain Edu? --      Excl. in [place]? --    No data found.  Updated Vital Signs BP 128/81 (BP Location: Left Arm)   Pulse (!) 106   Temp 98.7 F (37.1 C) (Oral)   Resp 16   Wt 157 lb 12.8 oz (71.6 kg)   LMP 09/21/2019   SpO2 95%   BMI 29.82 kg/m   Visual Acuity Right Eye Distance:   Left Eye Distance:   Bilateral Distance:    Right Eye Near:   Left Eye Near:    Bilateral Near:     Physical Exam Vitals and nursing note reviewed.  Constitutional:      General: She is not in acute distress.    Appearance: She is well-developed.  HENT:     Head: Normocephalic and atraumatic.     Nose: Congestion present.  Eyes:     Conjunctiva/sclera: Conjunctivae normal.  Cardiovascular:     Rate and Rhythm: Normal rate and regular rhythm.     Heart sounds: No murmur.  Pulmonary:     Effort: Pulmonary effort is normal. No respiratory distress.     Breath sounds: Normal breath  sounds.  Musculoskeletal:     Cervical back: Neck supple.  Skin:    General: Skin is warm and dry.  Neurological:     Mental Status: She is alert.      UC Treatments / Results  Labs (all labs ordered are listed, but only abnormal results are displayed) Labs Reviewed  NOVEL CORONAVIRUS, NAA (HOSP ORDER, SEND-OUT TO REF LAB; TAT 18-24 HRS)    EKG   Radiology No results found.  Procedures Procedures (including critical care time)  Medications Ordered in UC Medications - No data to display  Initial Impression / Assessment and Plan / UC Course  I have reviewed the triage vital signs and the nursing notes.  Pertinent labs & imaging results that were available during my care of the patient were reviewed by me and considered in my medical decision making (see chart for details).     Loss of taste and smell-Covid swab sent for testing with labs pending Work note given until we receive results.  Precautions given. Final Clinical Impressions(s) / UC Diagnoses   Final diagnoses:  Loss of taste  Loss of smell     Discharge Instructions     Your Covid swab is pending.  We should have the results in a couple days. Make sure you go home and take quarantine precautions. You can take over-the-counter medications as needed for your symptoms Follow up as needed for continued or worsening symptoms     ED Prescriptions    None     PDMP not reviewed this encounter.   Loura Halt A, NP 09/27/19 1626

## 2019-09-28 ENCOUNTER — Telehealth: Payer: Self-pay | Admitting: Emergency Medicine

## 2019-09-28 ENCOUNTER — Telehealth: Payer: Self-pay | Admitting: Obstetrics and Gynecology

## 2019-09-28 NOTE — Telephone Encounter (Signed)
Your test for COVID-19 was positive, meaning that you were infected with the novel coronavirus and could give the germ to others.  Please continue isolation at home for at least 10 days since the start of your symptoms. If you do not have symptoms, please isolate at home for 10 days from the day you were tested. Once you complete your 10 day quarantine, you may return to normal activities as long as you've not had a fever for over 24 hours(without taking fever reducing medicine) and your symptoms are improving. Please continue good preventive care measures, including:  frequent hand-washing, avoid touching your face, cover coughs/sneezes, stay out of crowds and keep a 6 foot distance from others.  Go to the nearest hospital emergency room if fever/cough/breathlessness are severe or illness seems like a threat to life.  Attempted to reach patient. No answer at this time. No voicemail set up  If you have any questions, you may call me at [phone]    

## 2019-09-28 NOTE — Telephone Encounter (Signed)
The patient left a message stating - Our last visit I was positive for an std and I'm still having slight vaginal issues that I'm concerned about.    Can you please give the patient a call to discuss possible prescription options.

## 2019-09-29 ENCOUNTER — Telehealth (HOSPITAL_COMMUNITY): Payer: Self-pay | Admitting: Emergency Medicine

## 2019-09-29 NOTE — Telephone Encounter (Signed)
Patient contacted by phone and made aware of   positive covid results. Pt verbalized understanding and had all questions answered. Quarantine ends Jan 8th.

## 2019-10-03 NOTE — Telephone Encounter (Signed)
Called pt to discuss her concerns about STD. Pt did not answer. LM for pt to call the office at her earliest convenience.

## 2019-10-11 ENCOUNTER — Other Ambulatory Visit (HOSPITAL_COMMUNITY)
Admission: RE | Admit: 2019-10-11 | Discharge: 2019-10-11 | Disposition: A | Discharge status: Home / Self Care | Payer: Medicaid Other | Source: Ambulatory Visit | Attending: Family Medicine | Admitting: Family Medicine

## 2019-10-11 ENCOUNTER — Ambulatory Visit (INDEPENDENT_AMBULATORY_CARE_PROVIDER_SITE_OTHER): Payer: Medicaid Other | Admitting: General Practice

## 2019-10-11 ENCOUNTER — Other Ambulatory Visit: Payer: Self-pay

## 2019-10-11 DIAGNOSIS — Z8619 Personal history of other infectious and parasitic diseases: Secondary | ICD-10-CM | POA: Insufficient documentation

## 2019-10-11 DIAGNOSIS — Z113 Encounter for screening for infections with a predominantly sexual mode of transmission: Secondary | ICD-10-CM

## 2019-10-11 NOTE — Progress Notes (Addendum)
Patient presents to office today reporting history last year of chlamydia, gonorrhea, & BV. She states for the past couple of weeks her stomach has felt off with some pain. Also reports spotting after intercourse and mild vaginal odor. Patient instructed in self swab & specimen collected. Discussed results will be back in 24-48 hours via mychart and we will send any needed prescriptions to her pharmacy. Patient verbalized understanding.  Chase Caller RN BSN 10/11/19   Chart reviewed for nurse visit. Agree with plan of care.   Currie Paris, NP 10/11/2019 5:23 PM

## 2019-10-12 ENCOUNTER — Other Ambulatory Visit: Payer: Self-pay | Admitting: Nurse Practitioner

## 2019-10-12 LAB — CERVICOVAGINAL ANCILLARY ONLY
Bacterial Vaginitis (gardnerella): POSITIVE — AB
Candida Glabrata: NEGATIVE
Candida Vaginitis: NEGATIVE
Chlamydia: NEGATIVE
Comment: NEGATIVE
Comment: NEGATIVE
Comment: NEGATIVE
Comment: NEGATIVE
Comment: NEGATIVE
Comment: NORMAL
Neisseria Gonorrhea: NEGATIVE
Trichomonas: NEGATIVE

## 2019-10-12 MED ORDER — METRONIDAZOLE 500 MG PO TABS
500.0000 mg | ORAL_TABLET | Freq: Two times a day (BID) | ORAL | 0 refills | Status: DC
Start: 2019-10-12 — End: 2020-03-12

## 2019-12-19 DIAGNOSIS — H16223 Keratoconjunctivitis sicca, not specified as Sjogren's, bilateral: Secondary | ICD-10-CM | POA: Diagnosis not present

## 2020-01-29 DIAGNOSIS — H1013 Acute atopic conjunctivitis, bilateral: Secondary | ICD-10-CM | POA: Diagnosis not present

## 2020-03-12 ENCOUNTER — Emergency Department (HOSPITAL_COMMUNITY)
Admission: EM | Admit: 2020-03-12 | Discharge: 2020-03-12 | Disposition: A | Discharge status: Home / Self Care | Payer: Medicaid Other | Attending: Emergency Medicine | Admitting: Emergency Medicine

## 2020-03-12 ENCOUNTER — Other Ambulatory Visit: Payer: Self-pay

## 2020-03-12 ENCOUNTER — Emergency Department (HOSPITAL_COMMUNITY): Payer: Medicaid Other

## 2020-03-12 ENCOUNTER — Encounter (HOSPITAL_COMMUNITY): Payer: Self-pay | Admitting: *Deleted

## 2020-03-12 DIAGNOSIS — Y999 Unspecified external cause status: Secondary | ICD-10-CM | POA: Diagnosis not present

## 2020-03-12 DIAGNOSIS — Y939 Activity, unspecified: Secondary | ICD-10-CM | POA: Diagnosis not present

## 2020-03-12 DIAGNOSIS — R0789 Other chest pain: Secondary | ICD-10-CM | POA: Insufficient documentation

## 2020-03-12 DIAGNOSIS — R519 Headache, unspecified: Secondary | ICD-10-CM | POA: Insufficient documentation

## 2020-03-12 DIAGNOSIS — J45909 Unspecified asthma, uncomplicated: Secondary | ICD-10-CM | POA: Diagnosis not present

## 2020-03-12 DIAGNOSIS — Y929 Unspecified place or not applicable: Secondary | ICD-10-CM | POA: Diagnosis not present

## 2020-03-12 MED ORDER — NAPROXEN 375 MG PO TABS: 375.0000 mg | ORAL_TABLET | Freq: Two times a day (BID) | ORAL | 0 refills | Status: DC

## 2020-03-12 MED ORDER — CYCLOBENZAPRINE HCL 10 MG PO TABS: 10.0000 mg | ORAL_TABLET | Freq: Every evening | ORAL | 0 refills | Status: DC

## 2020-03-12 MED ORDER — ACETAMINOPHEN 325 MG PO TABS
650.0000 mg | ORAL_TABLET | Freq: Once | ORAL | Status: AC
  Administered 2020-03-12: 650 mg via ORAL
  Filled 2020-03-12: qty 2

## 2020-03-12 NOTE — ED Notes (Signed)
POC Preg- Negative ?

## 2020-03-12 NOTE — ED Notes (Signed)
Patient transported to CT 

## 2020-03-12 NOTE — ED Notes (Signed)
Patient advised urine sample is needed.
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
## 2020-03-12 NOTE — Discharge Instructions (Addendum)
All of your CT scans and chest x-ray looks like there is no fracture or bleeds.  I want you to follow-up with a primary care in the next couple of days if he still experience pain.  You can take the Flexeril at night as needed for muscle pain and you can also take the naproxen for the next 10 days as an anti-inflammatory.  You can also use ice and heating pads.  I want you to come back to the emergency department if you have any new or worse concerning symptoms.  You can use the resource guide for outpatient counseling.I hope you feel better!
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
## 2020-10-11 ENCOUNTER — Ambulatory Visit (INDEPENDENT_AMBULATORY_CARE_PROVIDER_SITE_OTHER): Payer: Medicaid Other

## 2020-10-11 ENCOUNTER — Other Ambulatory Visit: Payer: Self-pay

## 2020-10-11 ENCOUNTER — Other Ambulatory Visit (HOSPITAL_COMMUNITY)
Admission: RE | Admit: 2020-10-11 | Discharge: 2020-10-11 | Disposition: A | Discharge status: Home / Self Care | Payer: Medicaid Other | Source: Ambulatory Visit | Attending: Obstetrics & Gynecology | Admitting: Obstetrics & Gynecology

## 2020-10-11 ENCOUNTER — Telehealth: Payer: Self-pay | Admitting: Family Medicine

## 2020-10-11 VITALS — BP 123/66 | HR 71 | Wt 156.0 lb

## 2020-10-11 DIAGNOSIS — Z1331 Encounter for screening for depression: Secondary | ICD-10-CM

## 2020-10-11 DIAGNOSIS — N898 Other specified noninflammatory disorders of vagina: Secondary | ICD-10-CM | POA: Insufficient documentation

## 2020-10-11 NOTE — Telephone Encounter (Signed)
Pt states she is having vaginal irritation and light discharge and needs to know if she needs to come in or just do annual/PAP visit thanks

## 2020-10-11 NOTE — Telephone Encounter (Signed)
Returned patients call. She reports she is having come vaginal irritation that is causing itching to the labia with a slight discharge. The discharge is yellow with a slight odor. She reports she has not experienced the itching in the past, she has had BV in the past.    Patient has a sexual partner that is the same one. She is unsure if she has been exposed to any STD. She reports she would like to make sure she has not been.   Reviewed her symptoms are consistent with BV and yeast and can call in prescriptions for both, Patient would prefer to come in to have a self swab prior to treatment to confirm and to rule out any other infections. Patient to come in at 2:20 today for self swab. Patient agreeable with appointment date and time.   Reviewed with patient that her last annual exam was in July of 2020 and that she does need to schedule for annual exam when she comes in for her self swab. Patient voiced understanding.

## 2020-10-11 NOTE — Progress Notes (Signed)
Pt here today c/o vaginal irritation and some discharge.  Pt reports that it started about a couple of days of ago and she just wanted to get checked out.  Pt explained how to obtain a self swab and that we will call with abnormal results.  Pt presents with elevated PHQ-9/GAD7 and is agreeable to an appt with [facility].  Referral via Epic. Pt verbalized understanding with no further questions.   Addison Naegeli, RN  10/11/20

## 2020-10-12 LAB — CERVICOVAGINAL ANCILLARY ONLY
Bacterial Vaginitis (gardnerella): POSITIVE — AB
Candida Glabrata: NEGATIVE
Candida Vaginitis: NEGATIVE
Chlamydia: NEGATIVE
Comment: NEGATIVE
Comment: NEGATIVE
Comment: NEGATIVE
Comment: NEGATIVE
Comment: NEGATIVE
Comment: NORMAL
Neisseria Gonorrhea: NEGATIVE
Trichomonas: POSITIVE — AB

## 2020-10-12 MED ORDER — METRONIDAZOLE 500 MG PO TABS: 500.0000 mg | ORAL_TABLET | Freq: Two times a day (BID) | ORAL | 0 refills | Status: AC

## 2020-10-12 NOTE — Addendum Note (Signed)
Addended by: Adam Phenix on: 10/12/2020 03:13 PM   Modules accepted: Orders

## 2020-10-12 NOTE — Progress Notes (Signed)
Patient ID: Chloe Curtis, female   DOB: December 26, 1990, 30 y.o.   MRN: [id number] Patient was assessed and managed by nursing staff during this encounter. I have reviewed the chart and agree with the documentation and plan. I have also made any necessary editorial changes. Trichomonas and BV detected, Flagyl prescribed.  Scheryl Darter, MD 10/12/2020 2:11 PM

## 2020-10-22 NOTE — BH Specialist Note (Signed)
Integrated Behavioral Health via Telemedicine Visit  10/22/2020 Chloe Curtis [id number]  Number of Integrated Behavioral Health visits: 1 Session Start time: 2:15  Session End time: 2:42 Total time: 27  Referring Provider: Nettie Elm, MD Patient/Family location: Home [facility] Provider location: Center for [facility]  All persons participating in visit: Patient Chloe Curtis and Chloe Curtis [name]   Types of Service: Individual psychotherapy  I connected with [name] and/or [name]'s n/a by Telephone  (Video is Surveyor, mining) and verified that I am speaking with the correct person using two identifiers.Discussed confidentiality: Yes   I discussed the limitations of telemedicine and the availability of in person appointments.  Discussed there is a possibility of technology failure and discussed alternative modes of communication if that failure occurs.  I discussed that engaging in this telemedicine visit, they consent to the provision of behavioral healthcare and the services will be billed under their insurance.  Patient and/or legal guardian expressed understanding and consented to Telemedicine visit: Yes   Presenting Concerns: Patient and/or family reports the following symptoms/concerns: Pt states she found out last night that her father passed away, and needs to process feelings of grief, along with making final arrangements.  Duration of problem: Today; Severity of problem: severe  Patient and/or Family's Strengths/Protective Factors: Social connections and Sense of purpose  Goals Addressed: Patient will: 1.  Reduce symptoms of: anxiety and stress  2.  Demonstrate ability to: Begin healthy grieving over loss  Progress towards Goals: Ongoing  Interventions: Interventions utilized:  Supportive Counseling Standardized Assessments completed: Not Needed  Patient and/or Family Response: Pt agrees  to treatment plan  Assessment: Patient currently experiencing Grief.   Patient may benefit from psychoeducation and brief therapeutic interventions regarding coping with symptoms of grief today .  Plan: 1. Follow up with behavioral health clinician on : Two weeks; Call Asher Muir at [phone] if needed, prior to scheduled visit 2. Behavioral recommendations:  -Allow feelings of grief to come; it's okay to feel exactly what you feel, and to allow daughter to see you feel grief  3. Referral(s): Integrated [employer] (In Clinic)  I discussed the assessment and treatment plan with the patient and/or parent/guardian. They were provided an opportunity to ask questions and all were answered. They agreed with the plan and demonstrated an understanding of the instructions.   They were advised to call back or seek an in-person evaluation if the symptoms worsen or if the condition fails to improve as anticipated.  [name], LCSW   Depression screen [facility] 2/9 10/11/2020 01/24/2015 07/01/2013  Decreased Interest 0 0 0  Down, Depressed, Hopeless 1 0 0  PHQ - 2 Score 1 0 0  Altered sleeping 0 - -  Tired, decreased energy 0 - -  Change in appetite 0 - -  Feeling bad or failure about yourself  1 - -  Trouble concentrating 0 - -  Moving slowly or fidgety/restless 0 - -  Suicidal thoughts 0 - -  PHQ-9 Score 2 - -   GAD 7 : Generalized Anxiety Score 10/11/2020  Nervous, Anxious, on Edge 2  Control/stop worrying 2  Worry too much - different things 3  Trouble relaxing 2  Easily annoyed or irritable 2  Afraid - awful might happen 2

## 2020-11-01 ENCOUNTER — Ambulatory Visit: Payer: Medicaid Other | Admitting: Clinical

## 2020-11-01 DIAGNOSIS — F4321 Adjustment disorder with depressed mood: Secondary | ICD-10-CM

## 2020-11-02 NOTE — BH Specialist Note (Signed)
Integrated Behavioral Health via Telemedicine Visit  11/02/2020 Chloe Curtis [id number]  Number of Integrated Behavioral Health visits: 2 Session Start time: 8:14  Session End time: 8:45 Total time: 31  Referring Provider: Nettie Elm, MD Patient/Family location: Home [facility] Provider location: Center for [facility] at [facility]  All persons participating in visit: Patient Chloe Curtis and [facility] Chloe Curtis   Types of Service: Individual psychotherapy  I connected with Chloe Curtis and/or Chloe Curtis's n/a by Video  (Video is Careg[employer] application) and verified that I am speaking with the correct person using two identifiers.Discussed confidentiality: Yes   I discussed the limitations of telemedicine and the availability of in person appointments.  Discussed there is a possibility of technology failure and discussed alternative modes of communication if that failure occurs.  I discussed that engaging in this telemedicine visit, they consent to the provision of behavioral healthcare and the services will be billed under their insurance.  Patient and/or legal guardian expressed understanding and consented to Telemedicine visit: Yes   Presenting Concerns: Patient and/or family reports the following symptoms/concerns: Pt states her primary concern today is ongoing life stress, in the midst of mourning loss of her father.  Duration of problem: ongoing stress; Severity of problem: moderate  Patient and/or Family's Strengths/Protective Factors: Social connections, Concrete supports in place (healthy food, safe environments, etc.) and Sense of purpose  Goals Addressed: Patient will: 1.  Demonstrate ability to: Continue healthy grieving over loss  Progress towards Goals: Ongoing  Interventions: Interventions utilized:  Supportive Counseling and Link to [employer] Standardized Assessments completed: Not Needed  Patient and/or  Family Response: Pt agrees to treatment plan  Assessment: Patient currently experiencing Grief and Psychosocial stress.   Patient may benefit from psychoeducation and brief therapeutic interventions regarding coping with grief and life stress .  Plan: 1. Follow up with behavioral health clinician on : Two weeks 2. Behavioral recommendations:  -Continue with plan to have immediate family memorial service to honor father's memory -Consider setting healthy boundaries in new living arrangement; sharing community resources (on After Visit Summary) with friend 3. Referral(s): Integrated [employer] (In Clinic)  I discussed the assessment and treatment plan with the patient and/or parent/guardian. They were provided an opportunity to ask questions and all were answered. They agreed with the plan and demonstrated an understanding of the instructions.   They were advised to call back or seek an in-person evaluation if the symptoms worsen or if the condition fails to improve as anticipated.  Valetta Close Chloe Baysinger, LCSW

## 2020-11-06 ENCOUNTER — Ambulatory Visit: Payer: Medicaid Other | Admitting: Certified Nurse Midwife

## 2020-11-15 ENCOUNTER — Other Ambulatory Visit: Payer: Self-pay

## 2020-11-15 ENCOUNTER — Ambulatory Visit (INDEPENDENT_AMBULATORY_CARE_PROVIDER_SITE_OTHER): Payer: Medicaid Other | Admitting: Clinical

## 2020-11-15 DIAGNOSIS — F4321 Adjustment disorder with depressed mood: Secondary | ICD-10-CM

## 2020-11-15 DIAGNOSIS — Z658 Other specified problems related to psychosocial circumstances: Secondary | ICD-10-CM | POA: Diagnosis not present

## 2020-11-15 NOTE — BH Specialist Note (Deleted)
Integrated Behavioral Health via Telemedicine Visit  11/15/2020 Chloe Curtis [id number]  Number of Integrated Behavioral Health visits: 3 Session Start time: 3:!5***  Session End time: 3:45***(or longer, if no visit after)*** Total time: {IBH Total PYPP:50932671}  Referring Provider: *** Patient/Family location: Home*** [facility] Provider location: Center for [facility] at [facility] for Women . All persons participating in visit: Patient *** and [facility] Chloe Curtis ***  Types of Service: {CHL AMB TYPE OF SERVICE:[phone]}  I connected with Chloe Curtis and/or Chloe Curtis's {family members:20773} by Telephone  (Video is Surveyor, mining) and verified that I am speaking with the correct person using two identifiers.Discussed confidentiality: {YES/NO:21197}  I discussed the limitations of telemedicine and the availability of in person appointments.  Discussed there is a possibility of technology failure and discussed alternative modes of communication if that failure occurs.  I discussed that engaging in this telemedicine visit, they consent to the provision of behavioral healthcare and the services will be billed under their insurance.  Patient and/or legal guardian expressed understanding and consented to Telemedicine visit: {YES/NO:21197}  Presenting Concerns: Patient and/or family reports the following symptoms/concerns: *** Duration of problem: ***; Severity of problem: {Mild/Moderate/Severe:20260}  Patient and/or Family's Strengths/Protective Factors: {CHL AMB BH PROTECTIVE [phone]}  Goals Addressed: Patient will: 1.  Reduce symptoms of: {IBH Symptoms:21014056}  2.  Increase knowledge and/or ability of: {IBH Patient Tools:21014057}  3.  Demonstrate ability to: {IBH Goals:21014053}  Progress towards Goals: {CHL AMB BH PROGRESS TOWARDS [phone]}  Interventions: Interventions utilized:  {IBH  Interventions:21014054} Standardized Assessments completed: {IBH Screening Tools:21014051}  Patient and/or Family Response: ***  Assessment: Patient currently experiencing ***.   Patient may benefit from ***.  Plan: 1. Follow up with behavioral health clinician on : *** 2. Behavioral recommendations: *** 3. Referral(s): {IBH Referrals:21014055}  I discussed the assessment and treatment plan with the patient and/or parent/guardian. They were provided an opportunity to ask questions and all were answered. They agreed with the plan and demonstrated an understanding of the instructions.   They were advised to call back or seek an in-person evaluation if the symptoms worsen or if the condition fails to improve as anticipated.  Chloe Close Mckaylee Dimalanta, LCSW

## 2020-11-15 NOTE — Patient Instructions (Signed)
Center for [facility] Healthcare at [facility] for Women [address] [place], [place] [address] [phone] (main office) [phone] [facility] office)  Housing Resources                    [employer] (serves [place], [place], [place], [place], [place], [place], [place], [place], [place], [place], [place], [place], and [place] counties) [address], [place], [place] [address] [phone] DeveloperU.ch  **Rental assistance, Home Rehabilitation,Weatherization Assistance Program, Chief Financial Officer, Housing Voucher Program   Housing Resources [facility]  Housing Authority- [place] [address] [place], [place] [address] [phone] www.gha-[place].[facility] [address] Tawny Hopping [place], [place] [address] [phone] PhoneCaptions.ch **Programs include: Hospital doctor and Housing Counseling, Healthy Radiographer, therapeutic, Homeless Prevention and Housing Assistance  Government [facility] [address], [place], [place] [address] [phone] www.PaintingEmporium.co.za **housing applications/recertification; tax payment relief/exemption under specific qualifications  [facility] [address] [place], [place] [address] www.onlinegreensboro.com/~maryshouse **transitional housing for women in recovery who have minor children or are pregnant  [facility] [address] [place], [place], [place] [address] https://johnson-smith.net/  **emergency shelter and support services for families facing homelessness  [employer] [address], [place], [place] [address] [phone] www.youthfocus.org **transitional housing to pregnant women; emergency housing for youth who have run away, are experiencing a family crisis, are victims of abuse or neglect, or are homeless  [facility] [address], [place], [place]  [address] [phone] ircgso.org **Drop-in center for people experiencing homelessness; overnight warming center when temperature is 25 degrees or below  [employer] [address] [place], [place], [place] [address] [phone] https://reentrystaffingagency.org/ **help with affordable housing to people experiencing homelessness or unemployment due to incarceration  [facility] [address] [place], [place] [address] [phone] www.greensborourbanministry.org  **emergency and transitional housing, rent/mortgage assistance, utility assistance  [facility] [address] [place], [place] [address] [phone] www.salvationarmyofgreensboro.org **emergency and transitional housing  [employer] for [employer] [address], [place], [place] [address] [phone] Www.habitatgreensboro.[facility]   [employer] [address], [place], [place] [address] [phone] https://chshousing.org **Home Ownership/Affordable Housing Program and [facility]  [employer] [address], [place], [place] [address] [phone] PaidValue.com.cy **home buyer education courses, foreclosure prevention  [facility] [address], [place], [place] [address] [phone] DefMagazine.is **Environmental Exposure Assessment (investigation of homes where either children or pregnant women with a confirmed elevated blood lead level reside)  [facility] of [facility] [address] Nat Math [place], [place] [address] [phone] ShowReturn.ca **Home Expense Assistance/Repairs Program; offers home accessibility updates, such as ramps or bars in the bathroom  [employer] [address] [place], [place] [address] [phone] https://www.self-help.org/locations/[place]-branch **Offers credit-building and banking services to people unable to use traditional banking  Transportation Resources [employer] ([employer]) [address] [place] [place], [place], [place] [address] https://www.[place].gov/departments/transportation/gdot-divisions/[place]-transit-agency-public-transportation-division     . Fixed-route bus services, including regional fare cards for PART, [place], [place], and WSTA buses.  . Reduced fare bus ID's available for Medicaid, Medicare, and "orange card" recipients.  [employer]AT offers curb-to-curb and door-to-door bus services for people with disabilities who are unable to use a fixed-bus route; also offers a shared-ride program.   Helpful tips:  -Routes available online and physical maps available at the main bus hub lobby (each  for a specific route) -Smartphone directions often include bus routes (see the "bus" icon, next to the "car" and "walk" icons) -Routes differ on weekends, evenings and holidays, so plan ahead!  -If you have Medicaid, Medicare, or orange card, plan to obtain a reduced-fare ID to save 50% on rides. Check days and times to obtain an ID, and bring all necessary documents.   [employer] [place]) [address] [place], [place]. [address], [place], [place] [address] [phone] SpotApps.nl **Fixed-bus route services, and demand response bus service for older adults  Department of Social [facility] [address] [place], [place] [address] [phone] www.MysterySinger.com.cy **Medicaid transportation is available to [facility] recipients who need assistance getting to [facility] medical appointments and providers  [facility] [address], [place], [place]  [address] www.cjmedicaltransportation.com  ** Offers non-emergency transportation for medical appointments  [employer] [address] [place], [place] [address] [phone] www.wheels4hope.org **REFERRAL NEEDED by specific agencies (see website), after meeting specified criteria only  [employer] for [employer]) [address] [place], [place] [address] [phone]  BuyingShow.es  *Regional fixed-bus routes between counties (example: [place] to [place]) and [employer]

## 2020-11-27 NOTE — BH Specialist Note (Deleted)
Integrated Behavioral Health via Telemedicine Visit  11/27/2020 Chloe Curtis [id number]  Number of Integrated Behavioral Health visits: *** Session Start time: 9:45***  Session End time: 10:15*** Total time: {IBH Total Time:21014050}  Referring Provider: *** Patient/Family location: Home*** [facility] Provider location: [facility] at [facility] for Women  All persons participating in visit: Patient *** and [facility] Cinthia Rodden ***  Types of Service: {CHL AMB TYPE OF SERVICE:[phone]}  I connected with Eeva E Curtis and/or Turkey E Bhargava's {family members:20773} by Telephone  (Video is Surveyor, mining) and verified that I am speaking with the correct person using two identifiers.Discussed confidentiality: {YES/NO:21197}  I discussed the limitations of telemedicine and the availability of in person appointments.  Discussed there is a possibility of technology failure and discussed alternative modes of communication if that failure occurs.  I discussed that engaging in this telemedicine visit, they consent to the provision of behavioral healthcare and the services will be billed under their insurance.  Patient and/or legal guardian expressed understanding and consented to Telemedicine visit: {YES/NO:21197}  Presenting Concerns: Patient and/or family reports the following symptoms/concerns: *** Duration of problem: ***; Severity of problem: {Mild/Moderate/Severe:20260}  Patient and/or Family's Strengths/Protective Factors: {CHL AMB BH PROTECTIVE FACTORS:[phone]}  Goals Addressed: Patient will: 1.  Reduce symptoms of: {IBH Symptoms:21014056}  2.  Increase knowledge and/or ability of: {IBH Patient Tools:21014057}  3.  Demonstrate ability to: {IBH Goals:21014053}  Progress towards Goals: {CHL AMB BH PROGRESS TOWARDS GOALS:[phone]}  Interventions: Interventions utilized:  {IBH Interventions:21014054} Standardized Assessments  completed: {IBH Screening Tools:21014051}  Patient and/or Family Response: ***  Assessment: Patient currently experiencing ***.   Patient may benefit from ***.  Plan: 1. Follow up with behavioral health clinician on : *** 2. Behavioral recommendations: *** 3. Referral(s): {IBH Referrals:21014055}  I discussed the assessment and treatment plan with the patient and/or parent/guardian. They were provided an opportunity to ask questions and all were answered. They agreed with the plan and demonstrated an understanding of the instructions.   They were advised to call back or seek an in-person evaluation if the symptoms worsen or if the condition fails to improve as anticipated.  Valetta Close Crystalee Ventress, LCSW

## 2020-12-10 NOTE — BH Specialist Note (Signed)
Pt did not arrive to video visit and did not answer the phone; Left HIPPA-compliant message to call back Jesscia Imm from [facility] at  [phone] (Shwanda Soltis's office).  ?; left MyChart message for patient.  ? ?

## 2020-12-24 ENCOUNTER — Ambulatory Visit: Payer: Medicaid Other | Admitting: Clinical

## 2020-12-24 DIAGNOSIS — Z91199 Patient's noncompliance with other medical treatment and regimen due to unspecified reason: Secondary | ICD-10-CM

## 2020-12-24 DIAGNOSIS — Z5329 Procedure and treatment not carried out because of patient's decision for other reasons: Secondary | ICD-10-CM

## 2021-12-05 DIAGNOSIS — H5213 Myopia, bilateral: Secondary | ICD-10-CM | POA: Diagnosis not present

## 2021-12-25 ENCOUNTER — Other Ambulatory Visit: Payer: Self-pay

## 2021-12-25 ENCOUNTER — Emergency Department (HOSPITAL_COMMUNITY): Payer: Medicaid Other

## 2021-12-25 ENCOUNTER — Emergency Department (HOSPITAL_COMMUNITY)
Admission: EM | Admit: 2021-12-25 | Discharge: 2021-12-25 | Disposition: A | Discharge status: Home / Self Care | Payer: Medicaid Other | Attending: Emergency Medicine | Admitting: Emergency Medicine

## 2021-12-25 DIAGNOSIS — J4521 Mild intermittent asthma with (acute) exacerbation: Secondary | ICD-10-CM | POA: Insufficient documentation

## 2021-12-25 DIAGNOSIS — R0602 Shortness of breath: Secondary | ICD-10-CM | POA: Diagnosis not present

## 2021-12-25 MED ORDER — ALBUTEROL SULFATE HFA 108 (90 BASE) MCG/ACT IN AERS
2.0000 | INHALATION_SPRAY | Freq: Once | RESPIRATORY_TRACT | Status: AC
  Administered 2021-12-25: 2 via RESPIRATORY_TRACT
  Filled 2021-12-25: qty 6.7

## 2021-12-25 NOTE — Discharge Instructions (Addendum)
You were seen here today for evaluation of your SOB and cough. This was likely from your asthma. Please take home the inhaler you were given and use 1-2 puffs every 6 hours as needed for symptoms. Additionally, I have referred you to a primary care office that you can call to schedule an appointment with as you will likely need additional testing. You can starts a daily allergy medication such as Zyrtec or Claritin to help with your symptoms.  ? ?Contact a doctor if: ?You have wheezing, shortness of breath, or a cough even while taking medicine to prevent attacks. ?The mucus you cough up (sputum) is thicker than usual. ?The mucus you cough up changes from clear or white to yellow, green, gray, or bloody. ?You have problems from the medicine you are taking, such as: ?A rash. ?Itching. ?Swelling. ?Trouble breathing. ?You need reliever medicines more than 2-3 times a week. ?Your peak flow reading is still at 50-79% of your personal best after following the action plan for 1 hour. ?You have a fever. ?Get help right away if: ?You seem to be worse and are not responding to medicine during an asthma attack. ?You are short of breath even at rest. ?You get short of breath when doing very little activity. ?You have trouble eating, drinking, or talking. ?You have chest pain or tightness. ?You have a fast heartbeat. ?Your lips or fingernails start to turn blue. ?You are light-headed or dizzy, or you faint. ?Your peak flow is less than 50% of your personal best. ?You feel too tired to breathe normally. ?

## 2021-12-25 NOTE — ED Provider Notes (Signed)
?[facility] EMERGENCY DEPARTMENT ?Provider Note ? ? ?CSN: [id number] ?Arrival date & time: 12/25/21  4373 ? ?  ? ?History ?Chief Complaint  ?Patient presents with  ? Shortness of Breath  ? ? ?Chloe Curtis is a 31 y.o. female with h/o asthma presents to the ED for evaluation of SOB and cough today. The patient denies any nasal congestion, rhinorrhea, sore throat, or ear pain.  She reports that she usually has an asthma exacerbation about once a year for the past 3 to 4 years and comes in for breathing treatments.  She does not take any daily medication for her asthma or and does not have any inhalers at home.  She does not have a primary care provider currently.  She denies any surgeries or recent surgeries.  Denies any daily medications.  She is allergic to an Advair Diskus although she reports that she is when she may contact to her lips but never inhaled the actual product.  She is a occasional marijuana smoker but denies any EtOH or illicit drug use.  LMP 11-27-2021. ? ? ?Shortness of Breath ?Associated symptoms: cough   ?Associated symptoms: no chest pain, no fever and no sore throat   ? ?  ? ?Home Medications ?Prior to Admission medications   ?Medication Sig Start Date End Date Taking? Authorizing Provider  ?acetaminophen (TYLENOL) 500 MG tablet Take 500 mg by mouth every 6 (six) hours as needed for mild pain.   Yes [provider]  ?albuterol (PROVENTIL HFA;VENTOLIN HFA) 108 (90 BASE) MCG/ACT inhaler Inhale 2 puffs into the lungs every 4 (four) hours as needed for wheezing or shortness of breath. ?Patient not taking: Reported on 10/11/2020 03/31/14   Tyrone Nine, MD  ?cyclobenzaprine (FLEXERIL) 10 MG tablet Take 1 tablet (10 mg total) by mouth at bedtime. ?Patient not taking: Reported on 10/11/2020 03/12/20   Farrel Gordon, PA-C  ?naproxen (NAPROSYN) 375 MG tablet Take 1 tablet (375 mg total) by mouth 2 (two) times daily. ?Patient not taking: Reported on 10/11/2020 03/12/20   Farrel Gordon, PA-C  ?   ? ?Allergies    ?Advair diskus [fluticasone-salmeterol]   ? ?Review of Systems   ?Review of Systems  ?Constitutional:  Negative for chills and fever.  ?HENT:  Negative for congestion, rhinorrhea and sore throat.   ?Respiratory:  Positive for cough and shortness of breath.   ?Cardiovascular:  Negative for chest pain.  ? ?Physical Exam ?Updated Vital Signs ?BP 116/77 (BP Location: Left Arm)   Pulse 84   Temp 97.6 ?F (36.4 ?C) (Oral)   Resp 19   Ht 5\' 1"  (1.549 m)   Wt 81.2 kg   LMP 11/27/2021   SpO2 100%   BMI 33.82 kg/m?  ?Physical Exam ?Vitals and nursing note reviewed.  ?Constitutional:   ?   General: She is not in acute distress. ?   Appearance: Normal appearance. She is not toxic-appearing.  ?HENT:  ?   Head: Normocephalic and atraumatic.  ?   Right Ear: Tympanic membrane, ear canal and external ear normal.  ?   Left Ear: Tympanic membrane, ear canal and external ear normal.  ?   Nose: Nose normal.  ?   Mouth/Throat:  ?   Mouth: Mucous membranes are moist.  ?   Pharynx: No oropharyngeal exudate or posterior oropharyngeal erythema.  ?   Comments: Pharyngeal erythema, edema, or exudate noted.  Moist base membranes.  Uvula midline.  Airway patent. Tongue ring present. ?Eyes:  ?  General: No scleral icterus. ?Cardiovascular:  ?   Rate and Rhythm: Normal rate and regular rhythm.  ?Pulmonary:  ?   Effort: Pulmonary effort is normal.  ?   Breath sounds: Normal breath sounds. No decreased breath sounds or wheezing.  ?   Comments: Clear to auscultation bilaterally.  No respiratory distress, accessory muscle use, tripoding, nasal flaring, retractions, or cyanosis present.  Patient is speaking in full sentences with ease and is satting 100% on room air without any increased work of breathing. ?Musculoskeletal:     ?   General: No deformity.  ?   Cervical back: Normal range of motion.  ?Skin: ?   General: Skin is warm and dry.  ?Neurological:  ?   General: No focal deficit present.  ?   Mental  Status: She is alert. Mental status is at baseline.  ? ? ?ED Results / Procedures / Treatments   ?Labs ?(all labs ordered are listed, but only abnormal results are displayed) ?Labs Reviewed - No data to display ? ?EKG ?EKG Interpretation ? ?Date/Time:  Wednesday December 25 2021 12:13:03 EDT ?Ventricular Rate:  75 ?PR Interval:  184 ?QRS Duration: 70 ?QT Interval:  372 ?QTC Calculation: 415 ?R Axis:   38 ?Text Interpretation: Normal sinus rhythm with sinus arrhythmia Normal ECG When compared with ECG of 12-Mar-2020 13:33, PREVIOUS ECG IS PRESENT Confirmed by Virgina Norfolk [phone]) on 12/25/2021 12:37:08 PM ? ?Radiology ?DG Chest 2 View ? ?Result Date: 12/25/2021 ?CLINICAL DATA:  Shortness of breath. EXAM: CHEST - 2 VIEW COMPARISON:  March 12, 2020. FINDINGS: No consolidation. No visible pleural effusions or pneumothorax. The cardiomediastinal silhouette is within normal limits. No evidence of acute osseous abnormality. IMPRESSION: No evidence of acute cardiopulmonary disease. Electronically Signed   By: Feliberto Harts M.D.   On: 12/25/2021 11:47   ? ?Procedures ?Procedures  ? ?Medications Ordered in ED ?Medications  ?albuterol (VENTOLIN HFA) 108 (90 Base) MCG/ACT inhaler 2 puff (2 puffs Inhalation Given 12/25/21 1004)  ? ? ?ED Course/ Medical Decision Making/ A&P ?  ?                        ?Medical Decision Making ?Amount and/or Complexity of Data Reviewed ?Radiology: ordered. ? ? ?31 year old female with history of asthma presents emergency department for evaluation of shortness of breath and cough onset today.  Differential diagnosis includes was not limited to asthma exacerbation, cough, viral illness, PE, ACS.  Vital signs are unremarkable.  Patient normotensive, afebrile, normal pulse rate, satting 100% on room air with any increased work of breathing.  Physical exam is unremarkable.  Lungs clear to auscultation bilaterally.  She has normal HEENT exam.  While in triage, the patient had audible wheezing but was given  albuterol which she took to puffs of and reports she felt extremely better.  On my exam, the patient does not have any wheezing or any increased work of breathing.  She speaking in full sentences with ease. ? ?I independently reviewed and interpreted the patient's imaging and agree with radiologist findings.  There is no lung consolidation or visible pleural effusions or pneumothorax.  There is no acute cardiopulmonary process.  Normal chest x-ray. ? ?Given that the patient's shortness of breath relieved with the inhaler as well as her audible wheezing at triage, this is likely asthma.  Of low suspicion for any PE as the patient is PERC negative.  Does not sound consistent with any viral illnesses the patient does not  have any other symptoms.  Low suspicion for any ACS given the normal EKG and the patient does not have any chest pain was only experiencing some chest tightness with the shortness of breath which is also relieved by the albuterol inhaler. ? ?I discussed the itching findings with the patient.  Recommended that she will need to follow-up with a PCP for further asthma evaluation with possible pulmonary function test.  She will take the albuterol inhaler at home with her.  Recommended daily allergy medication. We will refer her to a PCP.  Strict return precautions were discussed.  Patient agrees with plan.  Patient is stable being discharged home in good condition. ? ?Final Clinical Impression(s) / ED Diagnoses ?Final diagnoses:  ?Mild intermittent asthma with exacerbation  ? ? ?Rx / DC Orders ?ED Discharge Orders   ? ? None  ? ?  ? ? ?  ?Achille RichRansom, Bright Spielmann, PA-C ?12/25/21 1248 ? ?  ?Virgina NorfolkCuratolo, Adam, DO ?12/26/21 1201 ? ?

## 2021-12-25 NOTE — ED Notes (Signed)
Patient Alert and oriented to baseline. Stable and ambulatory to baseline. Patient verbalized understanding of the discharge instructions.  Patient belongings were taken by the patient.   

## 2021-12-25 NOTE — ED Provider Triage Note (Signed)
Emergency Medicine Provider Triage Evaluation Note ? ?Chloe Curtis , a 31 y.o. female  was evaluated in triage.  Pt complains of shortness of breath starting this morning.  Has a history of exercise-induced asthma, has not required medication for this in several years.  States that she was driving to work when she had some difficulty breathing and cough.  She was feeling fine leading up until today. ? ?Review of Systems  ?Positive: Shortness of breath, cough, wheezing ?Negative: Chest pain, fever ? ?Physical Exam  ?BP 116/77 (BP Location: Left Arm)   Pulse 84   Temp 97.6 ?F (36.4 ?C) (Oral)   Resp 19   SpO2 100%  ?Gen:   Awake, no distress   ?Resp:  Normal effort  ?MSK:   Moves extremities without difficulty  ?Other:  Audible wheezing in triage ? ?Medical Decision Making  ?Medically screening exam initiated at 9:53 AM.  Appropriate orders placed.  Chloe Curtis was informed that the remainder of the evaluation will be completed by another provider, this initial triage assessment does not replace that evaluation, and the importance of remaining in the ED until their evaluation is complete. ? ?Will give albuterol inhaler in triage ?  ?Chloe Bonzo T, PA-C ?12/25/21 1001 ? ?

## 2021-12-25 NOTE — ED Triage Notes (Signed)
Pt. Stated, driving to work and started having an asthma attack , 1st time since last year.  ?

## 2021-12-26 ENCOUNTER — Telehealth: Payer: Self-pay

## 2021-12-26 NOTE — Telephone Encounter (Signed)
Transition Care Management Follow-up Telephone Call ?Date of discharge and from where: 12/25/2021 from [facility] ?How have you been since you were released from the hospital? Patient stated that she is feeling better and did not have any questions or concerns at this time.  ?Any questions or concerns? No ? ?Items Reviewed: ?Did the pt receive and understand the discharge instructions provided? Yes  ?Medications obtained and verified? Yes  ?Other? No  ?Any new allergies since your discharge? No  ?Dietary orders reviewed? No ?Do you have support at home? Yes  ? ?Functional Questionnaire: (I = Independent and D = Dependent) ?ADLs: I ? ?Bathing/Dressing- I ? ?Meal Prep- I ? ?Eating- I ? ?Maintaining continence- I ? ?Transferring/Ambulation- I ? ?Managing Meds- I ? ? ?Follow up appointments reviewed: ? ?PCP Hospital f/u appt confirmed? No  Calling [facility] to est care.  ?Specialist Hospital f/u appt confirmed? No   ?Are transportation arrangements needed? No  ?If their condition worsens, is the pt aware to call PCP or go to the Emergency Dept.? Yes ?Was the patient provided with contact information for the PCP's office or ED? Yes ?Was to pt encouraged to call back with questions or concerns? Yes ? ?
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
## 2022-08-04 ENCOUNTER — Ambulatory Visit (INDEPENDENT_AMBULATORY_CARE_PROVIDER_SITE_OTHER): Payer: Medicaid Other | Admitting: Internal Medicine

## 2022-08-04 ENCOUNTER — Encounter: Payer: Self-pay | Admitting: Internal Medicine

## 2022-08-04 VITALS — BP 118/79 | HR 79 | Ht 63.0 in | Wt 199.0 lb

## 2022-08-04 DIAGNOSIS — L299 Pruritus, unspecified: Secondary | ICD-10-CM | POA: Diagnosis not present

## 2022-08-04 DIAGNOSIS — Z0001 Encounter for general adult medical examination with abnormal findings: Secondary | ICD-10-CM

## 2022-08-04 DIAGNOSIS — Z131 Encounter for screening for diabetes mellitus: Secondary | ICD-10-CM | POA: Diagnosis not present

## 2022-08-04 DIAGNOSIS — Z2821 Immunization not carried out because of patient refusal: Secondary | ICD-10-CM

## 2022-08-04 DIAGNOSIS — Z1321 Encounter for screening for nutritional disorder: Secondary | ICD-10-CM

## 2022-08-04 DIAGNOSIS — Z1329 Encounter for screening for other suspected endocrine disorder: Secondary | ICD-10-CM

## 2022-08-04 DIAGNOSIS — G4700 Insomnia, unspecified: Secondary | ICD-10-CM | POA: Diagnosis not present

## 2022-08-04 DIAGNOSIS — Z13220 Encounter for screening for lipoid disorders: Secondary | ICD-10-CM

## 2022-08-04 DIAGNOSIS — E559 Vitamin D deficiency, unspecified: Secondary | ICD-10-CM | POA: Diagnosis not present

## 2022-08-04 DIAGNOSIS — Z139 Encounter for screening, unspecified: Secondary | ICD-10-CM | POA: Diagnosis not present

## 2022-08-04 NOTE — Progress Notes (Signed)
New Patient Office Visit  Subjective    Patient ID: Chloe Curtis, female    DOB: December 04, 1990  Age: 31 y.o. 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  She has not had a PCP in several years.  Today Ms. Schlink acute concerns or bilateral leg pain that she attributes to being on her feet for significant periods of time at work each day, itching in her right ear, insomnia, and fertility concerns.  She would like to establish care with a new OB/GYN and request information today.  [name] has chronic itching in her right ear.  She requests that I evaluate her right ear for any lesions today.  [name] also endorses insomnia, noting that she has difficulty staying asleep at night.  Acute concerns, chronic medical conditions, and outstanding preventative care items discussed today are individually addressed in A/P below.  Outpatient Encounter Medications as of 08/04/2022  Medication Sig   albuterol (PROVENTIL HFA;VENTOLIN HFA) 108 (90 BASE) MCG/ACT inhaler Inhale 2 puffs into the lungs every 4 (four) hours as needed for wheezing or shortness of breath.   diphenhydramine-acetaminophen (TYLENOL PM) 25-500 MG TABS tablet Take 1 tablet by mouth at bedtime as needed.   [DISCONTINUED] acetaminophen (TYLENOL) 500 MG tablet Take 500 mg by mouth every 6 (six) hours as needed for mild pain. (Patient not taking: Reported on 08/04/2022)   [DISCONTINUED] cyclobenzaprine (FLEXERIL) 10 MG tablet Take 1 tablet (10 mg total) by mouth at bedtime. (Patient not taking: Reported on 10/11/2020)   [DISCONTINUED] naproxen (NAPROSYN) 375 MG tablet Take 1 tablet (375 mg total) by mouth 2 (two) times  daily. (Patient not taking: Reported on 10/11/2020)   No facility-administered encounter medications on file as of 08/04/2022.    Past Medical History:  Diagnosis Date   Abscess and cellulitis    s/p I&D by Dr. Gerald Stabs PCP [facility])   Asthma    Chlamydia infection    x2.   Teenage mother 03/2010   Preterm labor and delivery @ 42 weeks.    History reviewed. No pertinent surgical history.  Family History  Problem Relation Age of Onset   Leukemia Paternal Grandmother    Drug abuse Father    Asthma Mother    Drug abuse Mother     Social History   Socioeconomic History   Marital status: Single    Spouse name: Not on file   Number of children: Not on file   Years of education: Not on file   Highest education level: Not on file  Occupational History   Not on file  Tobacco Use   Smoking status: Never   Smokeless tobacco: Never  Substance and Sexual Activity   Alcohol use: Yes    Comment: occassionally   Drug use: Yes    Types: Marijuana    Comment: occassionally   Sexual activity: Yes    Birth control/protection: None  Other Topics Concern   Not on file  Social History Narrative   Lives with grandmother, sister, aunt/uncle/cousin, and daughter (who is also patient of mine). Grandmother has custody over  patient and sister. Parents with h/o drug abuse.    FOB is patient's boyfriend and involved in baby's and patient's life.    Education: currently high school student @ [employer]; finished junior year June 2012;wants to study counseling/psychology         Social Determinants of Radio broadcast assistant Strain: Not on [employer] Insecurity: Not on file  Transportation Needs: Not on file  Physical Activity: Not on file  Stress: Not on file  Social Connections: Not on file  Intimate Partner Violence: Not on file   Review of Systems  Constitutional:  Negative for chills and fever.  HENT:  Positive for ear pain (Chronic right ear itching). Negative for  sore throat.   Respiratory:  Negative for cough and shortness of breath.   Cardiovascular:  Negative for chest pain, palpitations and leg swelling.  Gastrointestinal:  Negative for abdominal pain, blood in stool, constipation, diarrhea, nausea and vomiting.  Genitourinary:  Negative for dysuria and hematuria.  Musculoskeletal:  Negative for myalgias.       Bilateral leg pain, L>R.    Skin:  Negative for itching and rash.  Neurological:  Negative for dizziness and headaches.  Psychiatric/Behavioral:  Negative for depression and suicidal ideas. The patient has insomnia.    Objective    BP 118/79 (BP Location: Right Arm, Patient Position: Sitting, Cuff Size: Large)   Pulse 79   Ht 5\' 3"  (1.6 m)   Wt 199 lb (90.3 kg)   LMP 08/01/2022 (Approximate)   SpO2 97%   BMI 35.25 kg/m   Physical Exam Vitals reviewed.  Constitutional:      General: She is not in acute distress.    Appearance: Normal appearance. She is obese. She is not toxic-appearing.  HENT:     Head: Normocephalic and atraumatic.     Right Ear: Tympanic membrane, ear canal and external ear normal. There is no impacted cerumen.     Left Ear: Tympanic membrane, ear canal and external ear normal. There is no impacted cerumen.     Nose: Nose normal. No congestion or rhinorrhea.     Mouth/Throat:     Mouth: Mucous membranes are moist.     Pharynx: Oropharynx is clear. No oropharyngeal exudate or posterior oropharyngeal erythema.  Eyes:     General: No scleral icterus.    Extraocular Movements: Extraocular movements intact.     Conjunctiva/sclera: Conjunctivae normal.     Pupils: Pupils are equal, round, and reactive to light.  Cardiovascular:     Rate and Rhythm: Normal rate and regular rhythm.     Pulses: Normal pulses.     Heart sounds: Normal heart sounds. No murmur heard.    No friction rub. No gallop.  Pulmonary:     Effort: Pulmonary effort is normal.     Breath sounds: Normal breath sounds. No wheezing, rhonchi or  rales.  Abdominal:     General: Abdomen is flat. Bowel sounds are normal. There is no distension.     Palpations: Abdomen is soft.     Tenderness: There is no abdominal tenderness.  Musculoskeletal:        General: No swelling. Normal range of motion.     Cervical back: Normal range of motion.     Right lower leg: No edema.     Left lower leg: No edema.  Lymphadenopathy:     Cervical: No cervical adenopathy.  Skin:    General: Skin is warm and dry.  Capillary Refill: Capillary refill takes less than 2 seconds.     Coloration: Skin is not jaundiced.  Neurological:     General: No focal deficit present.     Mental Status: She is alert and oriented to person, place, and time.  Psychiatric:        Mood and Affect: Mood normal.        Behavior: Behavior normal.     Assessment & Plan:   Problem List Items Addressed This Visit       Insomnia    She endorses insomnia today and states that she has difficulty staying asleep.  She has not tried any over-the-counter medications for sleep aid.  We reviewed basic sleep hygiene measures and she acknowledged that there are areas for improvement. -She will try incorporating proper sleep hygiene measures and we will follow-up in 4 weeks      Itching of ear    She endorses chronic itching of her right ear.  There are no concerning findings on exam today.  I recommended that she purchase dry ear relief oil at her pharmacy.  We will follow-up in 4 weeks.      Encounter for general adult medical examination with abnormal findings    Presenting today to establish care.  Previous records and labs reviewed. -Repeat labs ordered today -Outstanding vaccines were declined -She will return to care in 4 weeks for Pap smear      Return in about 4 weeks (around 09/01/2022).   Johnette Abraham, MD

## 2022-08-04 NOTE — Patient Instructions (Signed)
It was a pleasure to see you today.  Thank you for giving [place] the opportunity to be involved in your care.  Below is a brief recap of your visit and next steps.  We will plan to see you again in 4 weeks.  Summary You have established care today We will check labs I recommend trying dry ear relief oil for your ears. This can be purchased over the counter We will follow up in 4 weeks for pap smear

## 2022-08-05 ENCOUNTER — Other Ambulatory Visit: Payer: Self-pay | Admitting: Internal Medicine

## 2022-08-05 DIAGNOSIS — E559 Vitamin D deficiency, unspecified: Secondary | ICD-10-CM

## 2022-08-05 LAB — CBC WITH DIFFERENTIAL/PLATELET
Basophils Absolute: 0 10*3/uL (ref 0.0–0.2)
Basos: 0 %
EOS (ABSOLUTE): 0.1 10*3/uL (ref 0.0–0.4)
Eos: 1 %
Hematocrit: 40.3 % (ref 34.0–46.6)
Hemoglobin: 13.2 g/dL (ref 11.1–15.9)
Immature Grans (Abs): 0 10*3/uL (ref 0.0–0.1)
Immature Granulocytes: 0 %
Lymphocytes Absolute: 3 10*3/uL (ref 0.7–3.1)
Lymphs: 37 %
MCH: 28.3 pg (ref 26.6–33.0)
MCHC: 32.8 g/dL (ref 31.5–35.7)
MCV: 87 fL (ref 79–97)
Monocytes Absolute: 0.4 10*3/uL (ref 0.1–0.9)
Monocytes: 5 %
Neutrophils Absolute: 4.5 10*3/uL (ref 1.4–7.0)
Neutrophils: 57 %
Platelets: 254 10*3/uL (ref 150–450)
RBC: 4.66 x10E6/uL (ref 3.77–5.28)
RDW: 12.6 % (ref 11.7–15.4)
WBC: 8 10*3/uL (ref 3.4–10.8)

## 2022-08-05 LAB — LIPID PANEL
Chol/HDL Ratio: 4.4 ratio (ref 0.0–4.4)
Cholesterol, Total: 136 mg/dL (ref 100–199)
HDL: 31 mg/dL — ABNORMAL LOW (ref >39)
LDL Chol Calc (NIH): 72 mg/dL (ref 0–99)
Triglycerides: 195 mg/dL — ABNORMAL HIGH (ref 0–149)
VLDL Cholesterol Cal: 33 mg/dL (ref 5–40)

## 2022-08-05 LAB — B12 AND FOLATE PANEL
Folate: 10.4 ng/mL (ref >3.0)
Vitamin B-12: 320 pg/mL (ref 232–1245)

## 2022-08-05 LAB — TSH+FREE T4
Free T4: 1.16 ng/dL (ref 0.82–1.77)
TSH: 1.05 u[IU]/mL (ref 0.450–4.500)

## 2022-08-05 LAB — CMP14+EGFR
ALT: 12 IU/L (ref 0–32)
AST: 14 IU/L (ref 0–40)
Albumin/Globulin Ratio: 1.4 (ref 1.2–2.2)
Albumin: 4.1 g/dL (ref 4.0–5.0)
Alkaline Phosphatase: 90 IU/L (ref 44–121)
BUN/Creatinine Ratio: 20 (ref 9–23)
BUN: 15 mg/dL (ref 6–20)
Bilirubin Total: 0.4 mg/dL (ref 0.0–1.2)
CO2: 21 mmol/L (ref 20–29)
Calcium: 9.2 mg/dL (ref 8.7–10.2)
Chloride: 106 mmol/L (ref 96–106)
Creatinine, Ser: 0.76 mg/dL (ref 0.57–1.00)
Globulin, Total: 2.9 g/dL (ref 1.5–4.5)
Glucose: 107 mg/dL — ABNORMAL HIGH (ref 70–99)
Potassium: 4.1 mmol/L (ref 3.5–5.2)
Sodium: 140 mmol/L (ref 134–144)
Total Protein: 7 g/dL (ref 6.0–8.5)
eGFR: 109 mL/min/{1.73_m2} (ref >59)

## 2022-08-05 LAB — VITAMIN D 25 HYDROXY (VIT D DEFICIENCY, FRACTURES): Vit D, 25-Hydroxy: 8.5 ng/mL — ABNORMAL LOW (ref 30.0–100.0)

## 2022-08-05 LAB — HEMOGLOBIN A1C
Est. average glucose Bld gHb Est-mCnc: 97 mg/dL
Hgb A1c MFr Bld: 5 % (ref 4.8–5.6)

## 2022-08-05 MED ORDER — VITAMIN D (ERGOCALCIFEROL) 1.25 MG (50000 UNIT) PO CAPS: 50000.0000 [IU] | ORAL_CAPSULE | ORAL | 0 refills | Status: AC

## 2022-08-08 DIAGNOSIS — Z0001 Encounter for general adult medical examination with abnormal findings: Secondary | ICD-10-CM | POA: Insufficient documentation

## 2022-08-08 DIAGNOSIS — L299 Pruritus, unspecified: Secondary | ICD-10-CM | POA: Insufficient documentation

## 2022-08-08 DIAGNOSIS — G4700 Insomnia, unspecified: Secondary | ICD-10-CM | POA: Insufficient documentation

## 2022-08-08 NOTE — Assessment & Plan Note (Signed)
She endorses chronic itching of her right ear.  There are no concerning findings on exam today.  I recommended that she purchase dry ear relief oil at her pharmacy.  We will follow-up in 4 weeks.

## 2022-08-08 NOTE — Assessment & Plan Note (Addendum)
She endorses insomnia today and states that she has difficulty staying asleep.  She has not tried any over-the-counter medications for sleep aid.  We reviewed basic sleep hygiene measures and she acknowledged that there are areas for improvement. -She will try incorporating proper sleep hygiene measures and we will follow-up in 4 weeks

## 2022-08-08 NOTE — Assessment & Plan Note (Signed)
Presenting today to establish care.  Previous records and labs reviewed. -Repeat labs ordered today -Outstanding vaccines were declined -She will return to care in 4 weeks for Pap smear

## 2022-09-01 ENCOUNTER — Ambulatory Visit (INDEPENDENT_AMBULATORY_CARE_PROVIDER_SITE_OTHER): Payer: Medicaid Other | Admitting: Internal Medicine

## 2022-09-01 ENCOUNTER — Encounter: Payer: Self-pay | Admitting: Internal Medicine

## 2022-09-01 ENCOUNTER — Other Ambulatory Visit (HOSPITAL_COMMUNITY)
Admission: RE | Admit: 2022-09-01 | Discharge: 2022-09-01 | Disposition: A | Discharge status: Home / Self Care | Payer: Medicaid Other | Source: Ambulatory Visit | Attending: Internal Medicine | Admitting: Internal Medicine

## 2022-09-01 VITALS — BP 133/82 | HR 75 | Ht 63.0 in | Wt 197.0 lb

## 2022-09-01 DIAGNOSIS — Z0001 Encounter for general adult medical examination with abnormal findings: Secondary | ICD-10-CM | POA: Diagnosis not present

## 2022-09-01 DIAGNOSIS — Z124 Encounter for screening for malignant neoplasm of cervix: Secondary | ICD-10-CM | POA: Insufficient documentation

## 2022-09-01 DIAGNOSIS — G4700 Insomnia, unspecified: Secondary | ICD-10-CM

## 2022-09-01 DIAGNOSIS — E559 Vitamin D deficiency, unspecified: Secondary | ICD-10-CM

## 2022-09-01 DIAGNOSIS — N979 Female infertility, unspecified: Secondary | ICD-10-CM | POA: Insufficient documentation

## 2022-09-01 NOTE — Assessment & Plan Note (Signed)
She continues to endorse insomnia today despite incorporating appropriate sleep hygiene measures.  I have recommended trying melatonin 1 to 2 hours prior to bedtime.  She plans to try this and if ineffective will notify us.

## 2022-09-01 NOTE — Assessment & Plan Note (Signed)
Her vitamin D level was 8.5 on labs from last month.  High-dose, weekly supplementation x12 weeks was prescribed.  She endorses compliance currently.  Plan to repeat vitamin D level upon completion of supplementation.

## 2022-09-01 NOTE — Progress Notes (Signed)
Established Patient Office Visit  Subjective   Patient ID: Chloe Curtis, female    DOB: Apr 03, 1991  Age: 31 y.o. MRN: [id number]  Chief Complaint  Patient presents with   Follow-up   Chloe Curtis returns to care today.  She was last seen by me on 11/6 to establish care.  At that time she endorsed chronic itching in her right ear as well as left knee pain.  She also expressed an interest in finding a new OB/GYN to discuss fertility concerns.  Basic labs were obtained and 4-week follow-up was arranged for Pap smear.  There have been no acute interval events.  Today Chloe Curtis states that she feels well.  She continues to endorse insomnia that has not improved despite incorporating appropriate sleep hygiene measures.  She requests a referral to OB/GYN today to establish care to discuss fertility concerns.  High-dose, weekly vitamin D was prescribed after baseline labs demonstrated vitamin D deficiency.  She has been taking the supplementation as prescribed.  Past Medical History:  Diagnosis Date   Abscess and cellulitis    s/p I&D by Dr. Gerald Stabs PCP [facility])   Asthma    Chlamydia infection    x2.   Teenage mother 03/2010   Preterm labor and delivery @ 37 weeks.   History reviewed. No pertinent surgical history. Social History   Tobacco Use   Smoking status: Never   Smokeless tobacco: Never  Substance Use Topics   Alcohol use: Yes    Comment: occassionally   Drug use: Yes    Types: Marijuana    Comment: occassionally   Family History  Problem Relation Age of Onset   Leukemia Paternal Grandmother    Drug abuse Father    Asthma Mother    Drug abuse Mother    Allergies  Allergen Reactions   Advair Diskus [Fluticasone-Salmeterol] Swelling   Review of Systems  Skin:  Positive for itching (right ear (chronic)).  Psychiatric/Behavioral:  The patient has insomnia.   All other systems reviewed and are negative.    Objective:     BP 133/82   Pulse 75   Ht  _0  (1.6 m)   Wt 197 lb (89.4 kg)   LMP 08/01/2022 (Approximate)   SpO2 96%   BMI 34.90 kg/m  BP Readings from Last 3 Encounters:  09/01/22 133/82  08/04/22 118/79  12/25/21 109/77   Physical Exam Vitals reviewed. Exam conducted with a chaperone present.  Constitutional:      General: She is not in acute distress.    Appearance: Normal appearance. She is obese. She is not toxic-appearing.  HENT:     Head: Normocephalic and atraumatic.     Right Ear: Tympanic membrane, ear canal and external ear normal. There is no impacted cerumen.     Left Ear: Tympanic membrane, ear canal and external ear normal. There is no impacted cerumen.     Nose: Nose normal. No congestion or rhinorrhea.     Mouth/Throat:     Mouth: Mucous membranes are moist.     Pharynx: Oropharynx is clear. No oropharyngeal exudate or posterior oropharyngeal erythema.  Eyes:     General: No scleral icterus.    Extraocular Movements: Extraocular movements intact.     Conjunctiva/sclera: Conjunctivae normal.     Pupils: Pupils are equal, round, and reactive to light.  Cardiovascular:     Rate and Rhythm: Normal rate and regular rhythm.     Pulses: Normal pulses.     Heart  sounds: Normal heart sounds. No murmur heard.    No friction rub. No gallop.  Pulmonary:     Effort: Pulmonary effort is normal.     Breath sounds: Normal breath sounds. No wheezing, rhonchi or rales.  Abdominal:     General: Abdomen is flat. Bowel sounds are normal. There is no distension.     Palpations: Abdomen is soft.     Tenderness: There is no abdominal tenderness.  Genitourinary:    General: Normal vulva.     Exam position: Knee-chest position.     Labia:        Right: No rash or lesion.        Left: No rash or lesion.      Vagina: Normal.     Cervix: Normal.     Uterus: Normal.      Adnexa: Right adnexa normal and left adnexa normal.  Musculoskeletal:        General: No swelling. Normal range of motion.     Cervical back:  Normal range of motion.     Right lower leg: No edema.     Left lower leg: No edema.  Lymphadenopathy:     Cervical: No cervical adenopathy.  Skin:    General: Skin is warm and dry.     Capillary Refill: Capillary refill takes less than 2 seconds.     Coloration: Skin is not jaundiced.  Neurological:     General: No focal deficit present.     Mental Status: She is alert and oriented to person, place, and time.  Psychiatric:        Mood and Affect: Mood normal.        Behavior: Behavior normal.    Last CBC Lab Results  Component Value Date   WBC 8.0 08/04/2022   HGB 13.2 08/04/2022   HCT 40.3 08/04/2022   MCV 87 08/04/2022   MCH 28.3 08/04/2022   RDW 12.6 08/04/2022   PLT 254 57/26/2035   Last metabolic panel Lab Results  Component Value Date   GLUCOSE 107 (H) 08/04/2022   NA 140 08/04/2022   K 4.1 08/04/2022   CL 106 08/04/2022   CO2 21 08/04/2022   BUN 15 08/04/2022   CREATININE 0.76 08/04/2022   EGFR 109 08/04/2022   CALCIUM 9.2 08/04/2022   PROT 7.0 08/04/2022   ALBUMIN 4.1 08/04/2022   LABGLOB 2.9 08/04/2022   AGRATIO 1.4 08/04/2022   BILITOT 0.4 08/04/2022   ALKPHOS 90 08/04/2022   AST 14 08/04/2022   ALT 12 08/04/2022   ANIONGAP 7 08/31/2017   Last lipids Lab Results  Component Value Date   CHOL 136 08/04/2022   HDL 31 (L) 08/04/2022   LDLCALC 72 08/04/2022   TRIG 195 (H) 08/04/2022   CHOLHDL 4.4 08/04/2022   Last hemoglobin A1c Lab Results  Component Value Date   HGBA1C 5.0 08/04/2022   Last thyroid functions Lab Results  Component Value Date   TSH 1.050 08/04/2022   Last vitamin D Lab Results  Component Value Date   VD25OH 8.5 (L) 08/04/2022   Last vitamin B12 and Folate Lab Results  Component Value Date   VITAMINB12 320 08/04/2022   FOLATE 10.4 08/04/2022     Assessment & Plan:   Problem List Items Addressed This Visit       Insomnia    She continues to endorse insomnia today despite incorporating appropriate sleep  hygiene measures.  I have recommended trying melatonin 1 to 2 hours prior to bedtime.  She plans to try this and if ineffective will notify us.      Vitamin D deficiency    Her vitamin D level was 8.5 on labs from last month.  High-dose, weekly supplementation x12 weeks was prescribed.  She endorses compliance currently.  Plan to repeat vitamin D level upon completion of supplementation.      Screening for cervical cancer    Pap smear completed today.  No concerning findings on exam.      Female fertility problem    Today she requests referral to OB/GYN to discuss fertility concerns.  She is not currently attempting to get pregnant, however she is concerned about potential inability to conceive when pregnancy is desired in the future.  A referral has been placed to OB/GYN today.       Return in about 3 months (around 12/01/2022).    Johnette Abraham, MD

## 2022-09-01 NOTE — Patient Instructions (Signed)
It was a pleasure to see you today.  Thank you for giving [place] the opportunity to be involved in your care.  Below is a brief recap of your visit and next steps.  We will plan to see you again in 3 months.  Summary We completed your pap smear today I have placed a referral to OBGYN to discuss fertility concerns We will follow up in 3 months.

## 2022-09-01 NOTE — Assessment & Plan Note (Signed)
Pap smear completed today.  No concerning findings on exam. 

## 2022-09-01 NOTE — Assessment & Plan Note (Signed)
Today she requests referral to OB/GYN to discuss fertility concerns.  She is not currently attempting to get pregnant, however she is concerned about potential inability to conceive when pregnancy is desired in the future.  A referral has been placed to OB/GYN today.

## 2022-09-02 LAB — CYTOLOGY - PAP
Adequacy: ABSENT
Chlamydia: NEGATIVE
Comment: NEGATIVE
Comment: NORMAL
Diagnosis: NEGATIVE
Neisseria Gonorrhea: NEGATIVE

## 2022-09-11 ENCOUNTER — Emergency Department (HOSPITAL_BASED_OUTPATIENT_CLINIC_OR_DEPARTMENT_OTHER)
Admission: EM | Admit: 2022-09-11 | Discharge: 2022-09-11 | Disposition: A | Discharge status: Home / Self Care | Payer: Medicaid Other | Attending: Emergency Medicine | Admitting: Emergency Medicine

## 2022-09-11 ENCOUNTER — Encounter (HOSPITAL_BASED_OUTPATIENT_CLINIC_OR_DEPARTMENT_OTHER): Payer: Self-pay

## 2022-09-11 ENCOUNTER — Other Ambulatory Visit: Payer: Self-pay

## 2022-09-11 DIAGNOSIS — S61211A Laceration without foreign body of left index finger without damage to nail, initial encounter: Secondary | ICD-10-CM | POA: Diagnosis not present

## 2022-09-11 DIAGNOSIS — Z23 Encounter for immunization: Secondary | ICD-10-CM | POA: Insufficient documentation

## 2022-09-11 DIAGNOSIS — S6992XA Unspecified injury of left wrist, hand and finger(s), initial encounter: Secondary | ICD-10-CM | POA: Diagnosis present

## 2022-09-11 DIAGNOSIS — Y990 Civilian activity done for income or pay: Secondary | ICD-10-CM | POA: Insufficient documentation

## 2022-09-11 DIAGNOSIS — W268XXA Contact with other sharp object(s), not elsewhere classified, initial encounter: Secondary | ICD-10-CM | POA: Diagnosis not present

## 2022-09-11 MED ORDER — TETANUS-DIPHTH-ACELL PERTUSSIS 5-2.5-18.5 LF-MCG/0.5 IM SUSY
0.5000 mL | PREFILLED_SYRINGE | Freq: Once | INTRAMUSCULAR | Status: AC
  Administered 2022-09-11: 0.5 mL via INTRAMUSCULAR
  Filled 2022-09-11: qty 0.5

## 2022-09-11 MED ORDER — IBUPROFEN 800 MG PO TABS
800.0000 mg | ORAL_TABLET | Freq: Once | ORAL | Status: AC
  Administered 2022-09-11: 800 mg via ORAL
  Filled 2022-09-11: qty 1

## 2022-09-11 NOTE — ED Triage Notes (Signed)
Patient here POV from Work.  Endorses lacerating her Left Second Digit on a [employer] at work. 1 cm Laceration to Distal Portion.   NAD Noted during Triage. A&Ox4. GCS 15. Ambulatory.

## 2022-09-11 NOTE — ED Provider Notes (Signed)
[facility] EMERGENCY DEPT Provider Note   CSN: [id number] Arrival date & time: 09/11/22  1600     History Chief Complaint  Patient presents with   Laceration    Callee E Sw[place]and is a 31 y.o. female patient presents to the emergency department today for further evaluation of a laceration to the left second distal digit that occurred at work.  Patient states he was reaching into a bin and had a bunch of utensils when something caught her.  She is unsure what caught her.  Tetanus is up-to-date.  She did not sustain any other injury.   Laceration      Home Medications Prior to Admission medications   Medication Sig Start Date End Date Taking? Authorizing Provider  albuterol (PROVENTIL HFA;VENTOLIN HFA) 108 (90 BASE) MCG/ACT inhaler Inhale 2 puffs into the lungs every 4 (four) hours as needed for wheezing or shortness of breath. 03/31/14   Tyrone Nine, MD  diphenhydramine-acetaminophen (TYLENOL PM) 25-500 MG TABS tablet Take 1 tablet by mouth at bedtime as needed.    [provider]  Vitamin D, Ergocalciferol, (DRISDOL) 1.25 MG (50000 UNIT) CAPS capsule Take 1 capsule (50,000 Units total) by mouth every 7 (seven) days for 12 doses. 08/05/22 10/22/22  Billie Lade, MD      Allergies    Advair diskus [fluticasone-salmeterol]    Review of Systems   Review of Systems  All other systems reviewed and are negative.   Physical Exam Updated Vital Signs BP 136/79 (BP Location: Right Arm)   Pulse 90   Temp 98.1 F (36.7 C)   Resp 14   Ht 5\' 3"  (1.6 m)   Wt 89.4 kg   SpO2 97%   BMI 34.91 kg/m  Physical Exam Vitals and nursing note reviewed.  Constitutional:      Appearance: Normal appearance.  HENT:     Head: Normocephalic and atraumatic.  Eyes:     General:        Right eye: No discharge.        Left eye: No discharge.     Conjunctiva/sclera: Conjunctivae normal.  Pulmonary:     Effort: Pulmonary effort is normal.  Skin:    General: Skin is  warm and dry.     Findings: No rash.     Comments: Small half centimeter superficial laceration to the left second pad of the finger.  No nail involvement.  Neurological:     General: No focal deficit present.     Mental Status: She is alert.  Psychiatric:        Mood and Affect: Mood normal.        Behavior: Behavior normal.     ED Results / Procedures / Treatments   Labs (all labs ordered are listed, but only abnormal results are displayed) Labs Reviewed - No data to display  EKG None  Radiology No results found.  Procedures . Laceration Repair  Date/Time: 09/11/2022 8:36 PM  Performed by: 09/13/2022, PA-C Authorized by: Teressa Lower, PA-C   Consent:    Consent obtained:  Verbal   Consent given by:  Patient   Risks, benefits, and alternatives were discussed: yes     Risks discussed:  Infection   Alternatives discussed:  No treatment Universal protocol:    Procedure explained and questions answered to patient or proxy's satisfaction: yes     Relevant documents present and verified: yes     Test results available: yes     Imaging  studies available: no     Required blood products, implants, devices, and special equipment available: no     Site/side marked: yes     Immediately prior to procedure, a time out was called: no     Patient identity confirmed:  Verbally with patient and arm band Anesthesia:    Anesthesia method:  None Laceration details:    Location:  Finger   Finger location:  L index finger   Length (cm):  0.5   Depth (mm):  1 Pre-procedure details:    Preparation:  Patient was prepped and draped in usual sterile fashion Exploration:    Limited defect created (wound extended): no     Hemostasis achieved with:  Direct pressure   Imaging outcome: foreign body not noted     Wound exploration: wound explored through full range of motion and entire depth of wound visualized     Wound extent: areolar tissue not violated, fascia not violated,  no foreign body, no signs of injury, no nerve damage, no tendon damage, no underlying fracture and no vascular damage     Contaminated: no   Treatment:    Area cleansed with:  Povidone-iodine   Amount of cleaning:  Standard   Visualized foreign bodies/material removed: no     Debridement:  None   Undermining:  None Skin repair:    Repair method:  Tissue adhesive Approximation:    Approximation:  Close Repair type:    Repair type:  Simple Post-procedure details:    Dressing:  Bulky dressing   Procedure completion:  Tolerated well, no immediate complications Comments:     I was able to place the skin flap back over the wound and then tacked down with Dermabond.  Bleeding was controlled with a finger tourniquet and Dermabond.     Medications Ordered in ED Medications  ibuprofen (ADVIL) tablet 800 mg (800 mg Oral Given 09/11/22 2016)  Tdap (BOOSTRIX) injection 0.5 mL (0.5 mLs Intramuscular Given 09/11/22 2016)    ED Course/ Medical Decision Making/ A&P                           Medical Decision Making Yanel E Swaziland is a 31 y.o. female patient who presents to the emergency department today for further evaluation of a left second distal laceration over the pad.  There was evidence of a skin flap which I was able to salvage and placed back over the wound.  I was able to tack down the skin flap with Dermabond.  Please see procedure note for further detail.  Tdap was updated.  Strict turn precautions were discussed.  I discussed wound care with her.  She is safe for discharge.   Risk Prescription drug management.    Final Clinical Impression(s) / ED Diagnoses Final diagnoses:  Laceration of left index finger without foreign body without damage to nail, initial encounter    Rx / DC Orders ED Discharge Orders     None         Jolyn Lent 09/11/22 2040    Glynn Octave, MD 09/11/22 2311

## 2022-09-11 NOTE — Discharge Instructions (Signed)
Do not submerge wound in water for long periods of time.  Keep wound covered when you are working with food or with other people.  Please return to the emergency department for any worsening symptoms.  Otherwise follow-up with your primary care doctor.

## 2022-09-12 ENCOUNTER — Telehealth: Payer: Self-pay | Admitting: Licensed Clinical Social Worker

## 2022-09-12 NOTE — Patient Outreach (Addendum)
Transition Care Management Follow-up Telephone Call Date of discharge and from where: 09/11/22 from [facility] How have you been since you were released from the hospital? I am still in a lot of pain Any questions or concerns? No  Items Reviewed: Did the pt receive and understand the discharge instructions provided? Yes  Medications obtained and verified?  No meds ordered Other? No  Any new allergies since your discharge? No  Dietary orders reviewed? No Do you have support at home? Yes   Home Care and Equipment/Supplies: Were home health services ordered? no If so, what is the name of the agency? na  Has the agency set up a time to come to the patient's home? not applicable Were any new equipment or medical supplies ordered?  No What is the name of the medical supply agency? na Were you able to get the supplies/equipment? not applicable Do you have any questions related to the use of the equipment or supplies? na  Functional Questionnaire: (I = Independent and D = Dependent) ADLs: I  Bathing/Dressing- I  Meal Prep- I  Eating- I  Maintaining continence- I  Transferring/Ambulation- I  Managing Meds- I  Follow up appointments reviewed:  PCP Hospital f/u appt confirmed? ED Visit only [facility] f/u appt confirmed? No  Scheduled to  Are transportation arrangements needed? No  If their condition worsens, is the pt aware to call PCP or go to the Emergency Dept.? Yes Was the patient provided with contact information for the PCP's office or ED? Yes Was to pt encouraged to call back with questions or concerns? Yes  Dickie La, BSW, MSW, [employer] Managed Medicaid LCSW [facility]  [facility] [email or web address] Phone: [phone]

## 2022-09-15 ENCOUNTER — Other Ambulatory Visit (HOSPITAL_COMMUNITY)
Admission: RE | Admit: 2022-09-15 | Discharge: 2022-09-15 | Disposition: A | Discharge status: Home / Self Care | Payer: Medicaid Other | Source: Ambulatory Visit | Attending: Obstetrics & Gynecology | Admitting: Obstetrics & Gynecology

## 2022-09-15 ENCOUNTER — Encounter: Payer: Self-pay | Admitting: Obstetrics & Gynecology

## 2022-09-15 ENCOUNTER — Ambulatory Visit (INDEPENDENT_AMBULATORY_CARE_PROVIDER_SITE_OTHER): Payer: Medicaid Other | Admitting: Obstetrics & Gynecology

## 2022-09-15 VITALS — BP 114/70 | HR 84 | Ht 62.0 in | Wt 200.0 lb

## 2022-09-15 DIAGNOSIS — Z113 Encounter for screening for infections with a predominantly sexual mode of transmission: Secondary | ICD-10-CM | POA: Insufficient documentation

## 2022-09-15 DIAGNOSIS — N979 Female infertility, unspecified: Secondary | ICD-10-CM

## 2022-09-15 DIAGNOSIS — N898 Other specified noninflammatory disorders of vagina: Secondary | ICD-10-CM

## 2022-09-15 NOTE — Addendum Note (Signed)
Addended by: Annamarie Dawley on: 09/15/2022 10:52 AM   Modules accepted: Orders

## 2022-09-15 NOTE — Progress Notes (Signed)
Infertility Patient presents for evaluation of infertility. Patient and partner have been attempting conception for 2 years. Marital status: . Pregnancies with current partner: no.  Menstrual and Endocrine History LMP Patient's last menstrual period was 08/29/2022.  Menarche 13  Shortest interval 28  Longest interval 60   days not now  Duration of flow 5 days  Heavy menses yes  Clots yes  Intermenstrual bleeding no  Postcoital bleeding no  Dysmenorrhea yes  Amenorrhea no  Weight change Gained 50 lbs   Hirsutism no  Balding no  Acne no  Galactorrhea no   Obstetrical History 12 years ago  Gynecologic History Last PAP   Previous abdominal or pelvic surgery   Pelvic pain   Endometriosis   Hot flashes   DES exposure   Abnormal Pap   Cervix Cryo/cone   Sexually transmitted diseases Chlamydia x 3 trichomonas  Pelvic inflammatory disease yes 2016   Infertility and Endocrine Studies  none Basal body temperature   Endo with biopsy   Hysterosalpingogram   Post-coital test   Laparoscopy   Hormonal studies   Semen analysis   Other studies   Medications   Other therapies   Insemination    Sexual History Frequency 5 times per week  Satisfied yes  Dyspareunia no  Use of lubricant no  Douching no  Number of lifetime sex partners na   Contraception Depo provera nexplanon  Family History Thyroid problems   Heart condition or high blood pressure   Blood clot or stroke   Diabetes   Cancer   Birth defects/inherited diseases   Infectious diseases (mumps, TB, rubella)   Other medical problems    Habits Cigarettes:    Wife -  no    Husband - yes, 2 packs per week for 17 years Alcohol:    Wife -   occasional    Husband -  Marijuana:   Wife -  2 x per day   Husband -  2 x per day   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

## 2022-09-16 LAB — CERVICOVAGINAL ANCILLARY ONLY
Bacterial Vaginitis (gardnerella): POSITIVE — AB
Candida Glabrata: NEGATIVE
Candida Vaginitis: NEGATIVE
Chlamydia: NEGATIVE
Comment: NEGATIVE
Comment: NEGATIVE
Comment: NEGATIVE
Comment: NEGATIVE
Comment: NEGATIVE
Comment: NORMAL
Neisseria Gonorrhea: NEGATIVE
Trichomonas: NEGATIVE

## 2022-10-25 ENCOUNTER — Emergency Department (HOSPITAL_COMMUNITY)
Admission: EM | Admit: 2022-10-25 | Discharge: 2022-10-25 | Disposition: A | Discharge status: Home / Self Care | Payer: Medicaid Other | Attending: Emergency Medicine | Admitting: Emergency Medicine

## 2022-10-25 DIAGNOSIS — S161XXA Strain of muscle, fascia and tendon at neck level, initial encounter: Secondary | ICD-10-CM | POA: Insufficient documentation

## 2022-10-25 DIAGNOSIS — S39012A Strain of muscle, fascia and tendon of lower back, initial encounter: Secondary | ICD-10-CM | POA: Insufficient documentation

## 2022-10-25 DIAGNOSIS — S0990XA Unspecified injury of head, initial encounter: Secondary | ICD-10-CM | POA: Insufficient documentation

## 2022-10-25 DIAGNOSIS — W01190A Fall on same level from slipping, tripping and stumbling with subsequent striking against furniture, initial encounter: Secondary | ICD-10-CM | POA: Insufficient documentation

## 2022-10-25 DIAGNOSIS — R519 Headache, unspecified: Secondary | ICD-10-CM | POA: Diagnosis present

## 2022-10-25 DIAGNOSIS — R9431 Abnormal electrocardiogram [ECG] [EKG]: Secondary | ICD-10-CM | POA: Diagnosis not present

## 2022-10-25 DIAGNOSIS — T148XXA Other injury of unspecified body region, initial encounter: Secondary | ICD-10-CM

## 2022-10-25 DIAGNOSIS — W19XXXA Unspecified fall, initial encounter: Secondary | ICD-10-CM

## 2022-10-25 MED ORDER — IBUPROFEN 400 MG PO TABS
600.0000 mg | ORAL_TABLET | Freq: Once | ORAL | Status: AC
  Administered 2022-10-25: 600 mg via ORAL
  Filled 2022-10-25: qty 2

## 2022-10-25 NOTE — Discharge Instructions (Signed)
If you develop severe headache, vomiting, confusion, weakness or numbness in your arms or legs, trouble speaking, new or worsening pain, incontinence, or any other new/concerning symptoms then return to the ER for evaluation.

## 2022-10-25 NOTE — ED Provider Notes (Signed)
[facility] Provider Note   CSN: [id number] Arrival date & time: 10/25/22  2032     History  Chief Complaint  Patient presents with   Fall    Chloe Curtis is a 32 y.o. female.  HPI 32 year old female presents after a fall yesterday evening.  She was walking and slipped in mud.  She fell and hit a door.  She is not sure how but she fell mostly forward and hit the side and back of her head.  No loss of consciousness.  She did not fall down any stairs or further than her height.  She was a little sore last night and took some Tylenol PM.  When she woke up she was more sore and the soreness seems to be worsening.  She tells me she is sore "all over".  However mostly it is worst in her left knee and left shoulder.  Her upper neck and mid back also hurt.  Mild headache.  No vomiting, weakness or numbness, or incontinence.  She is not on blood thinners.  Home Medications Prior to Admission medications   Medication Sig Start Date End Date Taking? Authorizing Provider  albuterol (PROVENTIL HFA;VENTOLIN HFA) 108 (90 BASE) MCG/ACT inhaler Inhale 2 puffs into the lungs every 4 (four) hours as needed for wheezing or shortness of breath. Patient not taking: Reported on 09/15/2022 03/31/14   Patrecia Pour, MD  diphenhydramine-acetaminophen (TYLENOL PM) 25-500 MG TABS tablet Take 1 tablet by mouth at bedtime as needed. Patient not taking: Reported on 09/15/2022    [provider]      Allergies    Advair diskus [fluticasone-salmeterol]    Review of Systems   Review of Systems  Constitutional:  Negative for fever.  Respiratory:  Negative for cough and shortness of breath.   Cardiovascular:  Negative for chest pain.  Gastrointestinal:  Negative for abdominal pain.  Musculoskeletal:  Positive for back pain, myalgias and neck pain.  Neurological:  Positive for headaches.    Physical Exam Updated Vital Signs BP 120/77 (BP Location:  Right Arm)   Pulse 89   Temp 99.3 F (37.4 C) (Oral)   Resp 16   Ht 5\' 3"  (1.6 m)   Wt 88.5 kg   LMP 09/28/2022   SpO2 97%   BMI 34.54 kg/m  Physical Exam Vitals and nursing note reviewed.  Constitutional:      Appearance: She is well-developed.  HENT:     Head: Normocephalic and atraumatic.     Comments: No scalp hematoma or significant head injury Eyes:     Extraocular Movements: Extraocular movements intact.     Pupils: Pupils are equal, round, and reactive to light.  Cardiovascular:     Rate and Rhythm: Normal rate and regular rhythm.     Pulses:          Radial pulses are 2+ on the left side.       Dorsalis pedis pulses are 2+ on the left side.     Heart sounds: Normal heart sounds.  Pulmonary:     Effort: Pulmonary effort is normal.     Breath sounds: Normal breath sounds.  Abdominal:     Palpations: Abdomen is soft.     Tenderness: There is no abdominal tenderness.  Musculoskeletal:     Comments: She has paraspinal tenderness around her cervical and lumbar spine.  No midline/bony tenderness or step-offs. Some mild muscular pain over her left proximal  upper arm but no decreased range of motion of the shoulder or bony tenderness Mild lateral knee tenderness but normal range of motion, no swelling.  Skin:    General: Skin is warm and dry.  Neurological:     Mental Status: She is alert.     Comments: CN 3-12 grossly intact. 5/5 strength in all 4 extremities. Grossly normal sensation. Normal finger to nose.      ED Results / Procedures / Treatments   Labs (all labs ordered are listed, but only abnormal results are displayed) Labs Reviewed - No data to display  EKG EKG Interpretation  Date/Time:  Saturday October 25 2022 21:14:30 EST Ventricular Rate:  87 PR Interval:  166 QRS Duration: 83 QT Interval:  366 QTC Calculation: 441 R Axis:   57 Text Interpretation: Sinus rhythm no acute ST/T changes similar to Mar 2023 Confirmed by Sherwood Gambler [phone]) on  10/25/2022 9:57:38 PM  Radiology No results found.  Procedures Procedures    Medications Ordered in ED Medications  ibuprofen (ADVIL) tablet 600 mg (has no administration in time range)    ED Course/ Medical Decision Making/ A&P                             Medical Decision Making Risk Prescription drug management.   Patient is well-appearing.  My suspicion is she has muscular pain from her fall.  May be a mild head injury but I doubt significant head injury such as head bleed or skull fracture.  No bony tenderness on exam besides maybe a little bit of lateral knee tenderness.  However she has been ambulatory all day and has no swelling.  Offered that we could do x-rays though she is pretty sure she did not break anything and I also have pretty low suspicion.  Thus we decided together to hold off on x-rays and just discussed supportive care. otherwise she appears stable for discharge.        Final Clinical Impression(s) / ED Diagnoses Final diagnoses:  Fall, initial encounter  Minor head injury, initial encounter  Muscle strain    Rx / DC Orders ED Discharge Orders     None         Sherwood Gambler, MD 10/25/22 2203

## 2022-10-25 NOTE — ED Triage Notes (Addendum)
Pt to ED c/o of fall yesterday, reports slipped in mudd, pt now c/o "pain all over" Pt reports she did hit her head when she fell, but no LOC. Not on blood thinners, no obvious injuries noted in triage. Pt ambulatory in triage

## 2022-10-27 ENCOUNTER — Telehealth: Payer: Self-pay | Admitting: Licensed Clinical Social Worker

## 2022-10-27 NOTE — Patient Outreach (Signed)
Transition Care Management Follow-up Telephone Call Date of discharge and from where: 10/25/22 from [facility] ED How have you been since you were released from the hospital? I am healing Any questions or concerns? No  Items Reviewed: Did the pt receive and understand the discharge instructions provided? Yes  Medications obtained and verified?  Only OTC medicine and yes Other? No  Any new allergies since your discharge? No  Dietary orders reviewed? No Do you have support at home? Yes   Home Care and Equipment/Supplies: Were home health services ordered? no If so, what is the name of the agency? na  Has the agency set up a time to come to the patient's home? not applicable Were any new equipment or medical supplies ordered?  No What is the name of the medical supply agency? na Were you able to get the supplies/equipment? not applicable Do you have any questions related to the use of the equipment or supplies? na  Functional Questionnaire: (I = Independent and D = Dependent) ADLs: I  Bathing/Dressing- I  Meal Prep- I  Eating- I  Maintaining continence- I  Transferring/Ambulation- I  Managing Meds- I  Follow up appointments reviewed:  PCP Hospital f/u appt confirmed? No  ED visit only [facility] f/u appt confirmed? No   Are transportation arrangements needed? No  If their condition worsens, is the pt aware to call PCP or go to the Emergency Dept.? Yes Was the patient provided with contact information for the PCP's office or ED? Yes Was to pt encouraged to call back with questions or concerns? Yes  Eula Fried, BSW, MSW, [employer] Managed Medicaid [email or web address] Phone: [phone]

## 2022-12-01 ENCOUNTER — Ambulatory Visit: Payer: Medicaid Other | Admitting: Internal Medicine

## 2022-12-11 ENCOUNTER — Ambulatory Visit: Payer: Self-pay | Admitting: Internal Medicine

## 2022-12-11 ENCOUNTER — Encounter: Payer: Self-pay | Admitting: Internal Medicine

## 2023-08-11 ENCOUNTER — Encounter (HOSPITAL_COMMUNITY): Payer: Self-pay | Admitting: Emergency Medicine

## 2023-08-11 ENCOUNTER — Other Ambulatory Visit: Payer: Self-pay

## 2023-08-11 ENCOUNTER — Emergency Department (HOSPITAL_COMMUNITY)
Admission: EM | Admit: 2023-08-11 | Discharge: 2023-08-11 | Disposition: A | Discharge status: Home / Self Care | Payer: 59 | Attending: Emergency Medicine | Admitting: Emergency Medicine

## 2023-08-11 DIAGNOSIS — H5711 Ocular pain, right eye: Secondary | ICD-10-CM | POA: Diagnosis present

## 2023-08-11 MED ORDER — FLUORESCEIN SODIUM 1 MG OP STRP: 1.0000 | ORAL_STRIP | Freq: Once | OPHTHALMIC | Status: DC

## 2023-08-11 MED ORDER — TETRACAINE HCL 0.5 % OP SOLN
1.0000 [drp] | Freq: Once | OPHTHALMIC | Status: AC
  Administered 2023-08-11: 1 [drp] via OPHTHALMIC
  Filled 2023-08-11: qty 4

## 2023-08-11 MED ORDER — TETRACAINE HCL 0.5 % OP SOLN
1.0000 [drp] | Freq: Once | OPHTHALMIC | Status: DC
Start: 2023-08-11 — End: 2023-08-11

## 2023-08-11 MED ORDER — FLUORESCEIN SODIUM 1 MG OP STRP
ORAL_STRIP | OPHTHALMIC | Status: AC
  Administered 2023-08-11: 1 via OPHTHALMIC
  Filled 2023-08-11: qty 1

## 2023-08-11 MED ORDER — MOXIFLOXACIN HCL 0.5 % OP SOLN: 1.0000 [drp] | Freq: Three times a day (TID) | OPHTHALMIC | 0 refills | Status: AC

## 2023-08-11 MED ORDER — TETRACAINE HCL 0.5 % OP SOLN
2.0000 [drp] | Freq: Once | OPHTHALMIC | Status: DC
Start: 2023-08-11 — End: 2023-08-11

## 2023-08-11 NOTE — Discharge Instructions (Addendum)
Evaluation today was overall reassuring.  I am concerned he may have a corneal abrasion.  I am starting on eyedrops.  Please pick those up your pharmacy and administer this today.  Also recommend you follow-up with an eye doctor.

## 2023-08-11 NOTE — ED Triage Notes (Signed)
Pt presents with right eye irritation x 1 day.

## 2023-08-11 NOTE — ED Provider Notes (Signed)
[place] EMERGENCY DEPARTMENT AT [facility] Provider Note   CSN: [id number] Arrival date & time: 08/11/23  3235     History  Chief Complaint  Patient presents with   Eye Pain    Right   HPI Chloe Curtis is a 32 y.o. female presenting for right eye irritation.  Started yesterday evening.  Feels she had a scratch on her right eye.  Woke up this morning her eye was red.  Denies visual disturbance.  Denies pain but states is irritating.  States she does wear contact lenses.   Eye Pain       Home Medications Prior to Admission medications   Medication Sig Start Date End Date Taking? Authorizing Provider  moxifloxacin (VIGAMOX) 0.5 % ophthalmic solution Place 1 drop into the right eye 3 (three) times daily. 08/11/23  Yes Gareth Eagle, PA-C  albuterol (PROVENTIL HFA;VENTOLIN HFA) 108 (90 BASE) MCG/ACT inhaler Inhale 2 puffs into the lungs every 4 (four) hours as needed for wheezing or shortness of breath. Patient not taking: Reported on 09/15/2022 03/31/14   Tyrone Nine, MD  diphenhydramine-acetaminophen (TYLENOL PM) 25-500 MG TABS tablet Take 1 tablet by mouth at bedtime as needed. Patient not taking: Reported on 09/15/2022    [provider]      Allergies    Advair diskus [fluticasone-salmeterol]    Review of Systems   Review of Systems  Eyes:  Positive for pain.    Physical Exam Updated Vital Signs BP 124/87   Pulse 81   Temp 99 F (37.2 C) (Oral)   Resp 18   Ht 5\' 3"  (1.6 m)   Wt 90.7 kg   SpO2 99%   BMI 35.43 kg/m  Physical Exam Constitutional:      Appearance: Normal appearance.  HENT:     Head: Normocephalic.     Nose: Nose normal.  Eyes:     General:        Right eye: No foreign body.     Extraocular Movements: Extraocular movements intact.     Conjunctiva/sclera:     Right eye: Right conjunctiva is injected. No hemorrhage.    Pupils: Pupils are equal, round, and reactive to light.     Right eye: Fluorescein uptake  present. Seidel exam negative.  Pulmonary:     Effort: Pulmonary effort is normal.  Neurological:     Mental Status: She is alert.  Psychiatric:        Mood and Affect: Mood normal.     ED Results / Procedures / Treatments   Labs (all labs ordered are listed, but only abnormal results are displayed) Labs Reviewed - No data to display  EKG None  Radiology No results found.  Procedures Procedures    Medications Ordered in ED Medications  fluorescein 1 MG ophthalmic strip (has no administration in time range)  tetracaine (PONTOCAINE) 0.5 % ophthalmic solution 1 drop (has no administration in time range)    ED Course/ Medical Decision Making/ A&P                                 Medical Decision Making Risk Prescription drug management.   32 year old well-appearing female presenting for eye pain.  Exam notable for injected right eye and fluorescein uptake.  Given her symptoms at home along with fluorescein uptake, concern for corneal abrasion.  Sent moxifloxacin to her pharmacy and advised her to follow-up with ophthalmology.  Final Clinical Impression(s) / ED Diagnoses Final diagnoses:  Acute pain in right eye    Rx / DC Orders ED Discharge Orders          Ordered    moxifloxacin (VIGAMOX) 0.5 % ophthalmic solution  3 times daily        08/11/23 1004              Gareth Eagle, PA-C 08/11/23 1005    Tanda Rockers A, DO 08/12/23 [phone]

## 2023-09-03 ENCOUNTER — Emergency Department (HOSPITAL_COMMUNITY)
Admission: EM | Admit: 2023-09-03 | Discharge: 2023-09-04 | Disposition: A | Discharge status: Home / Self Care | Payer: 59 | Attending: Emergency Medicine | Admitting: Emergency Medicine

## 2023-09-03 ENCOUNTER — Other Ambulatory Visit: Payer: Self-pay

## 2023-09-03 ENCOUNTER — Encounter (HOSPITAL_COMMUNITY): Payer: Self-pay

## 2023-09-03 DIAGNOSIS — Z1152 Encounter for screening for COVID-19: Secondary | ICD-10-CM | POA: Diagnosis not present

## 2023-09-03 DIAGNOSIS — J45909 Unspecified asthma, uncomplicated: Secondary | ICD-10-CM | POA: Diagnosis not present

## 2023-09-03 DIAGNOSIS — R059 Cough, unspecified: Secondary | ICD-10-CM | POA: Diagnosis present

## 2023-09-03 DIAGNOSIS — J069 Acute upper respiratory infection, unspecified: Secondary | ICD-10-CM | POA: Insufficient documentation

## 2023-09-03 LAB — GROUP A STREP BY PCR: Group A Strep by PCR: NOT DETECTED

## 2023-09-03 LAB — RESP PANEL BY RT-PCR (RSV, FLU A&B, COVID)  RVPGX2
Influenza A by PCR: NEGATIVE
Influenza B by PCR: NEGATIVE
Resp Syncytial Virus by PCR: NEGATIVE
SARS Coronavirus 2 by RT PCR: NEGATIVE

## 2023-09-03 NOTE — ED Triage Notes (Signed)
Cough non-productive, sore throat, runny nose, painful to swallow x2 days   Heavy smell of marijuana in triage Pt denies cigarette and vape use

## 2023-09-04 NOTE — ED Provider Notes (Signed)
[place] EMERGENCY DEPARTMENT AT [facility] Provider Note   CSN: [id number] Arrival date & time: 09/03/23  2105     History  Chief Complaint  Patient presents with   Cough    Chloe Curtis is a 32 y.o. female.  Patient is a 32 year old female with history of asthma.  Patient presenting today for evaluation of cough and URI symptoms.  This has been worsening over the past 2 days.  She is felt chilled, but has not checked her temperature.  No chest pain or difficulty breathing.  She does describe some sore throat.  No ill contacts.  The history is provided by the patient.       Home Medications Prior to Admission medications   Medication Sig Start Date End Date Taking? Authorizing Provider  albuterol (PROVENTIL HFA;VENTOLIN HFA) 108 (90 BASE) MCG/ACT inhaler Inhale 2 puffs into the lungs every 4 (four) hours as needed for wheezing or shortness of breath. Patient not taking: Reported on 09/15/2022 03/31/14   Tyrone Nine, MD  diphenhydramine-acetaminophen (TYLENOL PM) 25-500 MG TABS tablet Take 1 tablet by mouth at bedtime as needed. Patient not taking: Reported on 09/15/2022    [provider]  moxifloxacin (VIGAMOX) 0.5 % ophthalmic solution Place 1 drop into the right eye 3 (three) times daily. 08/11/23   Gareth Eagle, PA-C      Allergies    Advair diskus [fluticasone-salmeterol]    Review of Systems   Review of Systems  All other systems reviewed and are negative.   Physical Exam Updated Vital Signs BP 132/81 (BP Location: Right Arm)   Pulse 88   Temp 99.9 F (37.7 C) (Oral)   Resp 18   Ht 5\' 2"  (1.575 m)   Wt 90.7 kg   SpO2 98%   BMI 36.58 kg/m  Physical Exam Vitals and nursing note reviewed.  Constitutional:      General: She is not in acute distress.    Appearance: She is well-developed. She is not diaphoretic.  HENT:     Head: Normocephalic and atraumatic.     Mouth/Throat:     Mouth: Mucous membranes are moist.      Pharynx: No oropharyngeal exudate or posterior oropharyngeal erythema.  Cardiovascular:     Rate and Rhythm: Normal rate and regular rhythm.     Heart sounds: No murmur heard.    No friction rub. No gallop.  Pulmonary:     Effort: Pulmonary effort is normal. No respiratory distress.     Breath sounds: Normal breath sounds. No wheezing.  Abdominal:     General: Bowel sounds are normal. There is no distension.     Palpations: Abdomen is soft.     Tenderness: There is no abdominal tenderness.  Musculoskeletal:        General: Normal range of motion.     Cervical back: Normal range of motion and neck supple.  Skin:    General: Skin is warm and dry.  Neurological:     General: No focal deficit present.     Mental Status: She is alert and oriented to person, place, and time.     ED Results / Procedures / Treatments   Labs (all labs ordered are listed, but only abnormal results are displayed) Labs Reviewed  GROUP A STREP BY PCR  RESP PANEL BY RT-PCR (RSV, FLU A&B, COVID)  RVPGX2    EKG None  Radiology No results found.  Procedures Procedures    Medications Ordered in  ED Medications - No data to display  ED Course/ Medical Decision Making/ A&P  Patient is a 32 year old female presenting with URI symptoms as described in the HPI.  She arrives with stable vital signs and is afebrile.  Physical examination basically unremarkable.  COVID/flu/RSV/strep all negative.  I highly suspect a viral etiology.  Patient advised to drink plenty of fluids and take over-the-counter medications.  To follow-up if not improving.  Work excuse given.  Final Clinical Impression(s) / ED Diagnoses Final diagnoses:  None    Rx / DC Orders ED Discharge Orders     None         Geoffery Lyons, MD 09/04/23 [phone]

## 2023-09-04 NOTE — Discharge Instructions (Signed)
Take over-the-counter medications as needed for relief of symptoms.  Drink plenty of fluids and get plenty of rest.  Follow-up with primary doctor if not improving in the next week.

## 2024-05-02 DIAGNOSIS — H5213 Myopia, bilateral: Secondary | ICD-10-CM | POA: Diagnosis not present

## 2024-09-07 ENCOUNTER — Emergency Department (HOSPITAL_COMMUNITY)
Admission: EM | Admit: 2024-09-07 | Discharge: 2024-09-07 | Discharge status: Against Medical Advice | Attending: Emergency Medicine | Admitting: Emergency Medicine

## 2024-09-07 DIAGNOSIS — Z5321 Procedure and treatment not carried out due to patient leaving prior to being seen by health care provider: Secondary | ICD-10-CM | POA: Insufficient documentation

## 2024-09-07 DIAGNOSIS — R52 Pain, unspecified: Secondary | ICD-10-CM | POA: Diagnosis present

## 2024-09-07 NOTE — ED Notes (Signed)
 Pt says she has decided not to finish checking in- pt will f/u with PCP if needed.
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
## 2024-10-12 ENCOUNTER — Encounter (HOSPITAL_COMMUNITY): Payer: Self-pay

## 2024-10-12 ENCOUNTER — Other Ambulatory Visit: Payer: Self-pay

## 2024-10-12 ENCOUNTER — Emergency Department (HOSPITAL_COMMUNITY)

## 2024-10-12 ENCOUNTER — Emergency Department (HOSPITAL_COMMUNITY)
Admission: EM | Admit: 2024-10-12 | Discharge: 2024-10-12 | Disposition: A | Discharge status: Home / Self Care | Attending: Emergency Medicine | Admitting: Emergency Medicine

## 2024-10-12 DIAGNOSIS — R748 Abnormal levels of other serum enzymes: Secondary | ICD-10-CM | POA: Diagnosis not present

## 2024-10-12 DIAGNOSIS — B349 Viral infection, unspecified: Secondary | ICD-10-CM | POA: Diagnosis not present

## 2024-10-12 DIAGNOSIS — R519 Headache, unspecified: Secondary | ICD-10-CM | POA: Diagnosis present

## 2024-10-12 LAB — COMPREHENSIVE METABOLIC PANEL WITH GFR
ALT: 64 U/L — ABNORMAL HIGH (ref 0–44)
AST: 51 U/L — ABNORMAL HIGH (ref 15–41)
Albumin: 4 g/dL (ref 3.5–5.0)
Alkaline Phosphatase: 125 U/L (ref 38–126)
Anion gap: 12 (ref 5–15)
BUN: 9 mg/dL (ref 6–20)
CO2: 21 mmol/L — ABNORMAL LOW (ref 22–32)
Calcium: 8.6 mg/dL — ABNORMAL LOW (ref 8.9–10.3)
Chloride: 107 mmol/L (ref 98–111)
Creatinine, Ser: 0.61 mg/dL (ref 0.44–1.00)
GFR, Estimated: >60 mL/min
Glucose, Bld: 106 mg/dL — ABNORMAL HIGH (ref 70–99)
Potassium: 3.7 mmol/L (ref 3.5–5.1)
Sodium: 139 mmol/L (ref 135–145)
Total Bilirubin: 0.5 mg/dL (ref 0.0–1.2)
Total Protein: 7.2 g/dL (ref 6.5–8.1)

## 2024-10-12 LAB — CBC WITH DIFFERENTIAL/PLATELET
Abs Immature Granulocytes: 0.02 K/uL (ref 0.00–0.07)
Basophils Absolute: 0 K/uL (ref 0.0–0.1)
Basophils Relative: 0 %
Eosinophils Absolute: 0.4 K/uL (ref 0.0–0.5)
Eosinophils Relative: 8 %
HCT: 41.5 % (ref 36.0–46.0)
Hemoglobin: 13.5 g/dL (ref 12.0–15.0)
Immature Granulocytes: 0 %
Lymphocytes Relative: 28 %
Lymphs Abs: 1.4 K/uL (ref 0.7–4.0)
MCH: 27.9 pg (ref 26.0–34.0)
MCHC: 32.5 g/dL (ref 30.0–36.0)
MCV: 85.7 fL (ref 80.0–100.0)
Monocytes Absolute: 0.5 K/uL (ref 0.1–1.0)
Monocytes Relative: 10 %
Neutro Abs: 2.8 K/uL (ref 1.7–7.7)
Neutrophils Relative %: 54 %
Platelets: 161 K/uL (ref 150–400)
RBC: 4.84 MIL/uL (ref 3.87–5.11)
RDW: 12.9 % (ref 11.5–15.5)
WBC: 5.1 K/uL (ref 4.0–10.5)
nRBC: 0 % (ref 0.0–0.2)

## 2024-10-12 LAB — RESP PANEL BY RT-PCR (RSV, FLU A&B, COVID)  RVPGX2
Influenza A by PCR: NEGATIVE
Influenza B by PCR: NEGATIVE
Resp Syncytial Virus by PCR: NEGATIVE
SARS Coronavirus 2 by RT PCR: NEGATIVE

## 2024-10-12 LAB — URINALYSIS, ROUTINE W REFLEX MICROSCOPIC
Bilirubin Urine: NEGATIVE
Glucose, UA: NEGATIVE mg/dL
Ketones, ur: NEGATIVE mg/dL
Leukocytes,Ua: NEGATIVE
Nitrite: NEGATIVE
Protein, ur: NEGATIVE mg/dL
Specific Gravity, Urine: 1.021 (ref 1.005–1.030)
pH: 6 (ref 5.0–8.0)

## 2024-10-12 LAB — TROPONIN T, HIGH SENSITIVITY: Troponin T High Sensitivity: <15 ng/L (ref 0–19)

## 2024-10-12 LAB — HCG, SERUM, QUALITATIVE: Preg, Serum: NEGATIVE

## 2024-10-12 MED ORDER — PROCHLORPERAZINE EDISYLATE 10 MG/2ML IJ SOLN
10.0000 mg | Freq: Once | INTRAMUSCULAR | Status: AC
  Administered 2024-10-12: 10 mg via INTRAVENOUS
  Filled 2024-10-12: qty 2

## 2024-10-12 MED ORDER — SODIUM CHLORIDE 0.9 % IV BOLUS
1000.0000 mL | Freq: Once | INTRAVENOUS | Status: AC
  Administered 2024-10-12: 1000 mL via INTRAVENOUS

## 2024-10-12 MED ORDER — NAPROXEN 500 MG PO TABS: 500.0000 mg | ORAL_TABLET | Freq: Two times a day (BID) | ORAL | 0 refills | Status: AC

## 2024-10-12 MED ORDER — KETOROLAC TROMETHAMINE 15 MG/ML IJ SOLN
15.0000 mg | Freq: Once | INTRAMUSCULAR | Status: AC
  Administered 2024-10-12: 15 mg via INTRAVENOUS
  Filled 2024-10-12: qty 1

## 2024-10-12 MED ORDER — DIPHENHYDRAMINE HCL 50 MG/ML IJ SOLN
25.0000 mg | Freq: Once | INTRAMUSCULAR | Status: AC
  Administered 2024-10-12: 25 mg via INTRAVENOUS
  Filled 2024-10-12: qty 1

## 2024-10-12 NOTE — ED Provider Notes (Signed)
 " [place] EMERGENCY DEPARTMENT AT [facility] Provider Note   CSN: [id number] Arrival date & time: 10/12/24  9047     Patient presents with: Generalized Body Aches   Chloe Curtis is a 34 y.o. female.   Patient is a 34 year old female who presents to the emergency department with a chief complaint of generalized bodyaches, chills, nausea, foggy feeling in her head which has been ongoing for approximate the past 3 days.  Patient notes that she has had some intermittent pain in her chest but denies any associated shortness of breath.  There has been no abdominal pain or diarrhea.  She denies any dysuria or hematuria.  She does admit to an associated headache.  She has had no pain to neck or back.  She does admit to intermittent lightheadedness but has no direct dizziness or syncope.  She denies any numbness, paresthesias or weakness.        Prior to Admission medications  Medication Sig Start Date End Date Taking? Authorizing Provider  albuterol  (PROVENTIL  HFA;VENTOLIN  HFA) 108 (90 BASE) MCG/ACT inhaler Inhale 2 puffs into the lungs every 4 (four) hours as needed for wheezing or shortness of breath. Patient not taking: Reported on 09/15/2022 03/31/14   Bryn Bernardino NOVAK, MD  diphenhydramine -acetaminophen  (TYLENOL  PM) 25-500 MG TABS tablet Take 1 tablet by mouth at bedtime as needed. Patient not taking: Reported on 09/15/2022    [provider]  moxifloxacin  (VIGAMOX ) 0.5 % ophthalmic solution Place 1 drop into the right eye 3 (three) times daily. 08/11/23   Lang Norleen POUR, PA-C    Allergies: Advair diskus [fluticasone-salmeterol]    Review of Systems  Constitutional:  Positive for chills and fatigue.  Gastrointestinal:  Positive for nausea.  Neurological:  Positive for headaches.  All other systems reviewed and are negative.   Updated Vital Signs BP 122/60 (BP Location: Right Arm)   Pulse 96   Temp 97.8 F (36.6 C) (Temporal)   Resp 18   Ht 5' 2 (1.575  m)   Wt 92.1 kg   LMP 09/11/2024 (Approximate)   SpO2 98%   BMI 37.14 kg/m   Physical Exam Vitals and nursing note reviewed.  Constitutional:      General: She is not in acute distress.    Appearance: Normal appearance. She is not ill-appearing.  HENT:     Head: Normocephalic and atraumatic.     Nose: Nose normal.     Mouth/Throat:     Mouth: Mucous membranes are moist.  Eyes:     Extraocular Movements: Extraocular movements intact.     Conjunctiva/sclera: Conjunctivae normal.     Pupils: Pupils are equal, round, and reactive to light.  Cardiovascular:     Rate and Rhythm: Normal rate and regular rhythm.     Pulses: Normal pulses.     Heart sounds: Normal heart sounds. No murmur heard.    No gallop.  Pulmonary:     Effort: Pulmonary effort is normal. No respiratory distress.     Breath sounds: Normal breath sounds. No stridor. No wheezing, rhonchi or rales.  Abdominal:     General: Abdomen is flat. Bowel sounds are normal. There is no distension.     Palpations: Abdomen is soft.     Tenderness: There is no abdominal tenderness. There is no guarding.  Musculoskeletal:        General: No swelling, tenderness, deformity or signs of injury. Normal range of motion.     Cervical back: Normal range of  motion and neck supple. No rigidity or tenderness.  Skin:    General: Skin is warm and dry.     Findings: No rash.  Neurological:     General: No focal deficit present.     Mental Status: She is alert and oriented to person, place, and time. Mental status is at baseline.     Cranial Nerves: No cranial nerve deficit.     Sensory: No sensory deficit.     Motor: No weakness.     Coordination: Coordination normal.     Gait: Gait normal.  Psychiatric:        Mood and Affect: Mood normal.        Behavior: Behavior normal.        Thought Content: Thought content normal.        Judgment: Judgment normal.     (all labs ordered are listed, but only abnormal results are  displayed) Labs Reviewed  RESP PANEL BY RT-PCR (RSV, FLU A&B, COVID)  RVPGX2  COMPREHENSIVE METABOLIC PANEL WITH GFR  CBC WITH DIFFERENTIAL/PLATELET  URINALYSIS, ROUTINE W REFLEX MICROSCOPIC  HCG, SERUM, QUALITATIVE  TROPONIN T, HIGH SENSITIVITY    EKG: None  Radiology: No results found.   Procedures   Medications Ordered in the ED  sodium chloride  0.9 % bolus 1,000 mL (has no administration in time range)  prochlorperazine  (COMPAZINE ) injection 10 mg (has no administration in time range)  diphenhydrAMINE  (BENADRYL ) injection 25 mg (has no administration in time range)  ketorolac  (TORADOL ) 15 MG/ML injection 15 mg (has no administration in time range)                                    Medical Decision Making Patient is doing well at this time and is stable for discharge home.  Discussed with patient that all workup in the emergency department has been unremarkable except for a mild elevation in her liver function.  Still suspect that symptoms may be secondary to an acute viral syndrome given her body aches, intermittent headaches.  Patient had no acute changes on her blood work.  CT scan of the head and chest x-ray were unremarkable.  Do not suspect any further emergent workup is warranted at this time.  Symptoms have improved with IV fluids and medications in the emergency department.  Discussed the need for close follow-up with the PCP on an outpatient basis.  Strict return precautions were provided as well for any new or worsening symptoms.  Patient voiced understanding and had no additional questions.  Amount and/or Complexity of Data Reviewed Labs: ordered. Radiology: ordered.  Risk Prescription drug management.        Final diagnoses:  None    ED Discharge Orders     None          Daralene Lonni JONETTA DEVONNA 10/12/24 1350    Melvenia Motto, MD 10/12/24 1502  "

## 2024-10-12 NOTE — ED Triage Notes (Signed)
 Pt arrived via POV c/o generalized body aches, chills and nausea that began this past Sunday.

## 2024-10-12 NOTE — Discharge Instructions (Signed)
 Please continue good oral hydration on an outpatient basis.  Follow-up with a primary care doctor on an outpatient basis for recheck of your liver function.  Return to the emergency department immediately for any new or worsening symptoms.  [facility] Primary Care Doctor List    Rollene Pesa, MD. Specialty: [facility] Medicine Contact information: [address]  [place] [address]  [phone]   Glendia Fielding, MD. Specialty: [facility] Medicine Contact information: [address]  [place] [address]  [phone]   Benita Outhouse, MD Specialty: Internal Medicine Contact information: [address] [place] [address]  [phone]   Darlyn Hurst, MD. Specialty: Internal Medicine Contact information: [address]  [place] [address]  [phone]    [facility] (Dr. Luke) Specialty: Family Medicine Contact information: [address]  [place] [address]  [phone]   Garnette Lolling, MD. Specialty: [facility] Medicine Contact information: [address]  [address]  [place] [address]  [phone]   Gaither Langton, MD. Specialty: Internal Medicine Contact information: [address]  [address]  [place] [address]  [phone]   [facility] Family Medicine: [address] [phone]  [place], Family medicine [address]  [phone]  [facility] [address] [place], [place] [phone]  [facility]: [address] [phone]    [facility]  [address] [place], [place] [address] [phone]  Services [facility] offers a variety of basic health services.  Services include but are not limited to: Blood pressure checks  Heart rate checks  Blood sugar checks  Urine analysis  Rapid strep tests  Pregnancy tests.  Health education and referrals  People needing more complex services will  be directed to a physician online. Using these virtual visits, doctors can evaluate and prescribe medicine and treatments. There will be no medication on-site, though [facility] will help patients fill their prescriptions at little to no cost.   For More information please go to: [email or web address]  Allergy and Asthma:    [address] [facility] [address] [address] [place] [phone]  Urology:  [address]  [place] [phone]  [facility]  [address] [place], [place] [phone]  Orthopedics   [address] [place], [place] [phone]  Endocrinology  [address] [place], [place] [phone]  Podiatry: [facility] [phone]
